# Patient Record
Sex: Female | Born: 1950 | Race: White | Hispanic: No | Smoking: Current every day smoker
Health system: Southern US, Community
[De-identification: ages and names within clinical notes are randomized; demographics above are authoritative.]

## PROBLEM LIST (undated history)

## (undated) DIAGNOSIS — T7840XA Allergy, unspecified, initial encounter: Secondary | ICD-10-CM

## (undated) DIAGNOSIS — Z8541 Personal history of malignant neoplasm of cervix uteri: Secondary | ICD-10-CM

## (undated) DIAGNOSIS — E785 Hyperlipidemia, unspecified: Secondary | ICD-10-CM

## (undated) DIAGNOSIS — C801 Malignant (primary) neoplasm, unspecified: Secondary | ICD-10-CM

## (undated) HISTORY — PX: ABDOMINAL HYSTERECTOMY: SHX81

## (undated) HISTORY — DX: Hyperlipidemia, unspecified: E78.5

## (undated) HISTORY — DX: Allergy, unspecified, initial encounter: T78.40XA

## (undated) HISTORY — DX: Personal history of malignant neoplasm of cervix uteri: Z85.41

---

## 2010-06-16 ENCOUNTER — Ambulatory Visit: Payer: Self-pay | Admitting: Nurse Practitioner

## 2011-06-22 ENCOUNTER — Ambulatory Visit: Payer: Self-pay | Admitting: Nurse Practitioner

## 2012-04-20 ENCOUNTER — Other Ambulatory Visit (HOSPITAL_COMMUNITY): Payer: Self-pay | Admitting: General Practice

## 2012-04-20 DIAGNOSIS — E785 Hyperlipidemia, unspecified: Secondary | ICD-10-CM

## 2012-05-03 ENCOUNTER — Ambulatory Visit: Payer: Self-pay

## 2012-05-04 ENCOUNTER — Other Ambulatory Visit (HOSPITAL_COMMUNITY): Payer: Self-pay

## 2012-06-23 ENCOUNTER — Ambulatory Visit: Payer: Self-pay

## 2013-07-04 ENCOUNTER — Ambulatory Visit: Payer: Self-pay | Admitting: Family

## 2014-06-13 ENCOUNTER — Other Ambulatory Visit: Payer: Self-pay | Admitting: Cardiology

## 2014-06-13 DIAGNOSIS — Z1231 Encounter for screening mammogram for malignant neoplasm of breast: Secondary | ICD-10-CM

## 2014-07-06 ENCOUNTER — Other Ambulatory Visit: Payer: Self-pay | Admitting: Cardiology

## 2014-07-06 ENCOUNTER — Ambulatory Visit: Payer: Self-pay

## 2014-07-06 ENCOUNTER — Ambulatory Visit
Admission: RE | Admit: 2014-07-06 | Discharge: 2014-07-06 | Disposition: A | Discharge status: Home / Self Care | Payer: MEDICARE | Source: Ambulatory Visit | Attending: Cardiology | Admitting: Cardiology

## 2014-07-06 DIAGNOSIS — Z1231 Encounter for screening mammogram for malignant neoplasm of breast: Secondary | ICD-10-CM | POA: Diagnosis present

## 2014-07-06 HISTORY — DX: Malignant (primary) neoplasm, unspecified: C80.1

## 2014-07-09 ENCOUNTER — Ambulatory Visit: Payer: Self-pay

## 2015-05-30 ENCOUNTER — Other Ambulatory Visit: Payer: Self-pay | Admitting: Internal Medicine

## 2015-05-30 DIAGNOSIS — Z1231 Encounter for screening mammogram for malignant neoplasm of breast: Secondary | ICD-10-CM

## 2015-07-09 ENCOUNTER — Ambulatory Visit
Admission: RE | Admit: 2015-07-09 | Discharge: 2015-07-09 | Disposition: A | Discharge status: Home / Self Care | Payer: MEDICARE | Source: Ambulatory Visit | Attending: Internal Medicine | Admitting: Internal Medicine

## 2015-07-09 ENCOUNTER — Other Ambulatory Visit: Payer: Self-pay | Admitting: Internal Medicine

## 2015-07-09 DIAGNOSIS — Z1231 Encounter for screening mammogram for malignant neoplasm of breast: Secondary | ICD-10-CM | POA: Insufficient documentation

## 2016-06-01 ENCOUNTER — Other Ambulatory Visit: Payer: Self-pay | Admitting: Internal Medicine

## 2016-06-01 DIAGNOSIS — Z1231 Encounter for screening mammogram for malignant neoplasm of breast: Secondary | ICD-10-CM

## 2016-06-22 ENCOUNTER — Ambulatory Visit
Admission: RE | Admit: 2016-06-22 | Discharge: 2016-06-22 | Disposition: A | Discharge status: Home / Self Care | Payer: MEDICARE | Source: Ambulatory Visit | Attending: Internal Medicine | Admitting: Internal Medicine

## 2016-06-22 DIAGNOSIS — Z1231 Encounter for screening mammogram for malignant neoplasm of breast: Secondary | ICD-10-CM

## 2016-07-13 ENCOUNTER — Ambulatory Visit
Admission: RE | Admit: 2016-07-13 | Discharge: 2016-07-13 | Disposition: A | Discharge status: Home / Self Care | Payer: MEDICARE | Source: Ambulatory Visit | Attending: Internal Medicine | Admitting: Internal Medicine

## 2016-07-13 DIAGNOSIS — Z1231 Encounter for screening mammogram for malignant neoplasm of breast: Secondary | ICD-10-CM | POA: Diagnosis not present

## 2017-02-09 ENCOUNTER — Ambulatory Visit: Payer: MEDICARE | Admitting: Internal Medicine

## 2017-02-09 ENCOUNTER — Telehealth: Payer: Self-pay

## 2017-02-09 DIAGNOSIS — Z0289 Encounter for other administrative examinations: Secondary | ICD-10-CM

## 2017-02-09 NOTE — Telephone Encounter (Signed)
Reason for call: fever 101 yesterday new, Symptoms: nausea, still able to drink fluid, flu like symptoms   Duration 2 weeks Medications:N/A NP establish care appt 02/16/17  Appointment available at 4 today Daughter Anderson [place] [phone]

## 2017-02-09 NOTE — Telephone Encounter (Signed)
Spoke with Dr Aundra Dubin and she agreed to see patient at 400pm today for acute visit only. Called patients daughter back and advised that Dr Aundra Dubin would see patient for acute problem only, she verbalized understanding.  She would then see patient back for establish care visit on 02/16/17.

## 2017-02-10 DIAGNOSIS — J209 Acute bronchitis, unspecified: Secondary | ICD-10-CM | POA: Diagnosis not present

## 2017-02-10 DIAGNOSIS — R05 Cough: Secondary | ICD-10-CM | POA: Diagnosis not present

## 2017-02-16 ENCOUNTER — Encounter: Payer: Self-pay | Admitting: Internal Medicine

## 2017-02-16 ENCOUNTER — Ambulatory Visit (INDEPENDENT_AMBULATORY_CARE_PROVIDER_SITE_OTHER): Payer: MEDICARE | Admitting: Internal Medicine

## 2017-02-16 VITALS — BP 134/70 | HR 85 | Temp 98.0°F | Ht 63.0 in | Wt 127.2 lb

## 2017-02-16 DIAGNOSIS — Z1159 Encounter for screening for other viral diseases: Secondary | ICD-10-CM | POA: Diagnosis not present

## 2017-02-16 DIAGNOSIS — Z1329 Encounter for screening for other suspected endocrine disorder: Secondary | ICD-10-CM

## 2017-02-16 DIAGNOSIS — Z122 Encounter for screening for malignant neoplasm of respiratory organs: Secondary | ICD-10-CM

## 2017-02-16 DIAGNOSIS — J3081 Allergic rhinitis due to animal (cat) (dog) hair and dander: Secondary | ICD-10-CM

## 2017-02-16 DIAGNOSIS — J309 Allergic rhinitis, unspecified: Secondary | ICD-10-CM | POA: Insufficient documentation

## 2017-02-16 DIAGNOSIS — Z13818 Encounter for screening for other digestive system disorders: Secondary | ICD-10-CM | POA: Diagnosis not present

## 2017-02-16 DIAGNOSIS — M25559 Pain in unspecified hip: Secondary | ICD-10-CM

## 2017-02-16 DIAGNOSIS — F17200 Nicotine dependence, unspecified, uncomplicated: Secondary | ICD-10-CM | POA: Diagnosis not present

## 2017-02-16 DIAGNOSIS — R32 Unspecified urinary incontinence: Secondary | ICD-10-CM

## 2017-02-16 DIAGNOSIS — J40 Bronchitis, not specified as acute or chronic: Secondary | ICD-10-CM | POA: Diagnosis not present

## 2017-02-16 DIAGNOSIS — M810 Age-related osteoporosis without current pathological fracture: Secondary | ICD-10-CM | POA: Diagnosis not present

## 2017-02-16 DIAGNOSIS — M25552 Pain in left hip: Secondary | ICD-10-CM

## 2017-02-16 DIAGNOSIS — E559 Vitamin D deficiency, unspecified: Secondary | ICD-10-CM | POA: Diagnosis not present

## 2017-02-16 DIAGNOSIS — E785 Hyperlipidemia, unspecified: Secondary | ICD-10-CM | POA: Diagnosis not present

## 2017-02-16 DIAGNOSIS — Z0000 Encounter for general adult medical examination without abnormal findings: Secondary | ICD-10-CM | POA: Diagnosis not present

## 2017-02-16 DIAGNOSIS — Z13220 Encounter for screening for lipoid disorders: Secondary | ICD-10-CM | POA: Diagnosis not present

## 2017-02-16 HISTORY — DX: Pain in unspecified hip: M25.559

## 2017-02-16 HISTORY — DX: Hyperlipidemia, unspecified: E78.5

## 2017-02-16 HISTORY — DX: Unspecified urinary incontinence: R32

## 2017-02-16 HISTORY — DX: Allergic rhinitis, unspecified: J30.9

## 2017-02-16 MED ORDER — CETIRIZINE HCL 10 MG PO CAPS: 1.0000 | ORAL_CAPSULE | Freq: Every evening | ORAL | 3 refills | Status: DC | PRN

## 2017-02-16 NOTE — Patient Instructions (Addendum)
Please sch labs 03/2017-04/2017 and f/u with me in [facility] 04/2017  Will refer for CT chest at [facility]  Take care   Smoking Tobacco Information Smoking tobacco will very likely harm your health. Tobacco contains a poisonous (toxic), colorless chemical called nicotine. Nicotine affects the brain and makes tobacco addictive. This change in your brain can make it hard to stop smoking. Tobacco also has other toxic chemicals that can hurt your body and raise your risk of many cancers. How can smoking tobacco affect me? Smoking tobacco can increase your chances of having serious health conditions, such as:  Cancer. Smoking is most commonly associated with lung cancer, but can lead to cancer in other parts of the body.  Chronic obstructive pulmonary disease (COPD). This is a long-term lung condition that makes it hard to breathe. It also gets worse over time.  High blood pressure (hypertension), heart disease, stroke, or heart attack.  Lung infections, such as pneumonia.  Cataracts. This is when the lenses in the eyes become clouded.  Digestive problems. This may include peptic ulcers, heartburn, and gastroesophageal reflux disease (GERD).  Oral health problems, such as gum disease and tooth loss.  Loss of taste and smell.  Smoking can affect your appearance by causing:  Wrinkles.  Yellow or stained teeth, fingers, and fingernails.  Smoking tobacco can also affect your social life.  Many workplaces, [employer], hotels, and public places are tobacco-free. This means that you may experience challenges in finding places to smoke when away from home.  The cost of a smoking habit can be expensive. Expenses for someone who smokes come in two ways: ? You spend money on a regular basis to buy tobacco. ? Your health care costs in the long-term are higher if you smoke.  Tobacco smoke can also affect the health of those around you. Children of smokers have greater chances of: ? Sudden infant death  syndrome (SIDS). ? Ear infections. ? Lung infections.  What lifestyle changes can be made?  Do not start smoking. Quit if you already do.  To quit smoking: ? Make a plan to quit smoking and commit yourself to it. Look for programs to help you and ask your health care provider for recommendations and ideas. ? Talk with your health care provider about using nicotine replacement medicines to help you quit. Medicine replacement medicines include gum, lozenges, patches, sprays, or pills. ? Do not replace cigarette smoking with electronic cigarettes, which are commonly called e-cigarettes. The safety of e-cigarettes is not known, and some may contain harmful chemicals. ? Avoid places, people, or situations that tempt you to smoke. ? If you try to quit but return to smoking, don't give up hope. It is very common for people to try a number of times before they fully succeed. When you feel ready again, give it another try.  Quitting smoking might affect the way you eat as well as your weight. Be prepared to monitor your eating habits. Get support in planning and following a healthy diet.  Ask your health care provider about having regular tests (screenings) to check for cancer. This may include blood tests, imaging tests, and other tests.  Exercise regularly. Consider taking walks, joining a gym, or doing yoga or exercise classes.  Develop skills to manage your stress. These skills include meditation. What are the benefits of quitting smoking? By quitting smoking, you may:  Lower your risk of getting cancer and other diseases caused by smoking.  Live longer.  Breathe better.  Lower  your blood pressure and heart rate.  Stop your addiction to tobacco.  Stop creating secondhand smoke that hurts other people.  Improve your sense of taste and smell.  Look better over time, due to having fewer wrinkles and less staining.  What can happen if changes are not made? If you do not stop smoking,  you may:  Get cancer and other diseases.  Develop COPD or other long-term (chronic) lung conditions.  Develop serious problems with your heart and blood vessels (cardiovascular system).  Need more tests to screen for problems caused by smoking.  Have higher, long-term healthcare costs from medicines or treatments related to smoking.  Continue to have worsening changes in your lungs, mouth, and nose.  Where to find support: To get support to quit smoking, consider:  Asking your health care provider for more information and resources.  Taking classes to learn more about quitting smoking.  Looking for local organizations that offer resources about quitting smoking.  Joining a support group for people who want to quit smoking in your local community.  Where to find more information: You may find more information about quitting smoking from:  HelpGuide.org: www.helpguide.org/articles/addictions/how-to-quit-smoking.htm  https://hall.com/: smokefree.gov  American Lung Association: www.lung.org  Contact a health care provider if:  You have problems breathing.  Your lips, nose, or fingers turn blue.  You have chest pain.  You are coughing up blood.  You feel faint or you pass out.  You have other noticeable changes that cause you to worry. Summary  Smoking tobacco can negatively affect your health, the health of those around you, your finances, and your social life.  Do not start smoking. Quit if you already do. If you need help quitting, ask your health care provider.  Think about joining a support group for people who want to quit smoking in your local community. There are many effective programs that will help you to quit this behavior. This information is not intended to replace advice given to you by your health care provider. Make sure you discuss any questions you have with your health care provider. Document Released: 01/14/2016 Document Revised: 01/14/2016 Document  Reviewed: 01/14/2016 Elsevier Interactive Patient Education  2018 [employer].  Chronic Bronchitis Chronic bronchitis is a lasting inflammation of the bronchial tubes, which are the tubes that carry air into your lungs. This is inflammation that occurs:  On most days of the week.  For at least three months at a time.  Over a period of two years in a row.  When the bronchial tubes are inflamed, they start to produce mucus. The inflammation and buildup of mucus make it more difficult to breathe. Chronic bronchitis is usually a permanent problem and is one type of chronic obstructive pulmonary disease (COPD). People with chronic bronchitis are at greater risk for getting repeated colds, or respiratory infections. What are the causes? Chronic bronchitis most often occurs in people who have:  Long-standing, severe asthma.  A history of smoking.  Asthma and who also smoke.  What are the signs or symptoms? Chronic bronchitis may cause the following:  A cough that brings up mucus (productive cough).  Shortness of breath.  Early morning headache.  Wheezing.  Chest discomfort.  Recurring respiratory infections.  How is this diagnosed? Your health care provider may confirm the diagnosis by:  Taking your medical history.  Performing a physical exam.  Taking a chest X-ray.  Performing pulmonary function tests.  How is this treated? Treatment involves controlling symptoms with medicines, oxygen  therapy, or making lifestyle changes, such as exercising and eating a healthy, well-balanced diet. Medicines could include:  Inhalers to improve air flow in and out of your lungs.  Antibiotics to treat bacterial infections, such as pneumonia, sinus infections, and acute bronchitis.  As a preventative measure, your health care provider may recommend routine vaccinations for influenza and pneumonia. This is to prevent infection and hospitalization since you may be more at risk for  these types of infections. Follow these instructions at home:  Take medicines only as directed by your health care provider.  If you smoke cigarettes, chew tobacco, or use electronic cigarettes, quit. If you need help quitting, ask your health care provider.  Avoid pollen, dust, animal dander, molds, smoke, and other things that cause shortness of breath or wheezing attacks.  Talk to your health care provider about possible exercise routines. Regular exercise is very important to help you feel better.  If you are prescribed oxygen use at home follow these guidelines: ? Never smoke while using oxygen. Oxygen does not burn or explode, but flammable materials will burn faster in the presence of oxygen. ? Keep a Data processing manager close by. Let your fire department know that you have oxygen in your home. ? Warn visitors not to smoke near you when you are using oxygen. Put up "no smoking" signs in your home where you most often use the oxygen. ? Regularly test your smoke detectors at home to make sure they work. If you receive care in your home from a nurse or other health care provider, he or she may also check to make sure your smoke detectors work.  Ask your health care provider whether you would benefit from a pulmonary rehabilitation program.  Do not wait to get medical care if you have any concerning symptoms. Delays could cause permanent injury and may be life threatening. Contact a health care provider if:  You have increased coughing or shortness of breath or both.  You have muscle aches.  You have chest pain.  Your mucus gets thicker.  Your mucus changes from clear or white to yellow, green, gray, or bloody. Get help right away if:  Your usual medicines do not stop your wheezing.  You have increased difficulty breathing.  You have any problems with the medicine you are taking, such as a rash, itching, swelling, or trouble breathing. This information is not intended to  replace advice given to you by your health care provider. Make sure you discuss any questions you have with your health care provider. Document Released: 10/16/2005 Document Revised: 05/09/2015 Document Reviewed: 02/06/2013 Elsevier Interactive Patient Education  [employer].

## 2017-02-16 NOTE — Progress Notes (Signed)
Pre visit review using our clinic review tool, if applicable. No additional management support is needed unless otherwise documented below in the visit note. 

## 2017-02-16 NOTE — Progress Notes (Signed)
Chief Complaint  Patient presents with  . Establish Care   Establish care  1. 02/10/17 went to Urgent care for bronchitis given zpak and prednisone starting at 50 mg. She feels somewhat better tried Robitussin DM, Delsym. She is smoker 20-30 years max 4 pks/week now 1-2 pks/week FH lung cancer in mother  2. Allergic rhinitis h/o allergies to cats wants zyrtec capsules filled  3. H/o left hip pain chronic since remote fall worse with sitting long periods 4. H/o HLD on Lipitor 20 mg qhs 5. H/o osteoporosis on Fosamax  6. Chronic urinary incontinence     Review of Systems  Constitutional: Negative for weight loss.  HENT: Negative for hearing loss.   Eyes:       No vision changes   Respiratory: Positive for cough. Negative for shortness of breath and wheezing.   Cardiovascular: Negative for chest pain.  Gastrointestinal: Negative for abdominal pain.  Musculoskeletal: Positive for joint pain. Negative for falls.  Skin: Negative for rash.  Neurological: Negative for headaches.  Psychiatric/Behavioral: Negative for memory loss.   Past Medical History:  Diagnosis Date  . Allergy    cats   . Cancer ([place])    cervical  . Hyperlipidemia    Past Surgical History:  Procedure Laterality Date  . ABDOMINAL HYSTERECTOMY     1983   Family History  Problem Relation Age of Onset  . Cancer Mother        lung cancer smoker died 39  . Muscular dystrophy Father   . Muscular dystrophy Sister   . Muscular dystrophy Sister   . Breast cancer Neg Hx    Social History   Socioeconomic History  . Marital status: Widowed    Spouse name: Not on file  . Number of children: Not on file  . Years of education: Not on file  . Highest education level: Not on file  Social Needs  . Financial resource strain: Not on file  . Food insecurity - worry: Not on file  . Food insecurity - inability: Not on file  . Transportation needs - medical: Not on file  . Transportation needs - non-medical: Not on file   Occupational History  . Not on file  Tobacco Use  . Smoking status: Current Every Day Smoker  . Smokeless tobacco: Never Used  Substance and Sexual Activity  . Alcohol use: No    Frequency: Never  . Drug use: No  . Sexual activity: No  Other Topics Concern  . Not on file  Social History Narrative   Widowed    Current Meds  Medication Sig  . alendronate (FOSAMAX) 70 MG tablet   . atorvastatin (LIPITOR) 20 MG tablet   . TRELEGY ELLIPTA 100-62.5-25 MCG/INH AEPB    Not on File No results found for this or any previous visit (from the past 2160 hour(s)). Objective  Body mass index is 22.53 kg/m. Wt Readings from Last 3 Encounters:  02/16/17 127 lb 3.2 oz (57.7 kg)   Temp Readings from Last 3 Encounters:  02/16/17 98 F (36.7 C) (Oral)   BP Readings from Last 3 Encounters:  02/16/17 134/70   Pulse Readings from Last 3 Encounters:  02/16/17 85   O2 sat room air 93%   Physical Exam  Constitutional: She is oriented to person, place, and time and well-developed, well-nourished, and in no distress.  HENT:  Head: Normocephalic and atraumatic.  Eyes: Conjunctivae are normal. Pupils are equal, round, and reactive to light.  Cardiovascular: Normal rate,  regular rhythm and normal heart sounds.  Pulmonary/Chest: Effort normal and breath sounds normal.  Abdominal: Soft. Bowel sounds are normal. There is no tenderness.  Neurological: She is alert and oriented to person, place, and time. Gait normal.  Skin: Skin is warm and dry.  Psychiatric: Mood, memory, affect and judgment normal.  Nursing note and vitals reviewed.   Assessment   1. Bronchitis, long term nicotine dependence (see HPI)  2. Allergic rhinitis  3. HLD  4. Left hip pain  5. Chronic urinary incontinence  6. HM Plan  1.  Completed tx  Order CT chest low dose  rec smoking cessation  2. rx refill Zyrtec  3. Cont lipitor 20 mg qhs  Check labs CMET, CBC, lipid, UA, TSH, T4 ,Hep B/C, vit D  In 3-04/2017  F/u  04/2017  4. Monitor if worsening do Xray  5. kegel exercises for now  Will disc other options in future  6.  Had flu shot at Dr. Harless Litten 2018 need to verify  Had pna at [facility] need verify [place]  Tdap 05/25/12  prevnar may have had 06/22/16  zostervax 01/23/09 disc shingrix today   mammo neg 07/13/16  Pap s/p hysterectomy had 1 nl years ago with ho abnormal; pap s/p hysterectomy nl per pt  Colonoscopy had [place] per pt review Dr. Harless Litten records  DEXA 04/2012 normal ? If others with Dr. Lavera Guise  +smoker rec cessation smoking 20-30 years h/o lung cancer in mother max 4 pks/week now 1-2 pks/week.   Signed form for jury duty today   Eye MD [facility]  Wears seatbelt, no guns  Provider: Dr. Olivia Mackie McLean-Scocuzza-Internal Medicine

## 2017-02-25 ENCOUNTER — Telehealth: Payer: Self-pay | Admitting: *Deleted

## 2017-02-25 DIAGNOSIS — Z87891 Personal history of nicotine dependence: Secondary | ICD-10-CM

## 2017-02-25 DIAGNOSIS — Z122 Encounter for screening for malignant neoplasm of respiratory organs: Secondary | ICD-10-CM

## 2017-02-25 NOTE — Telephone Encounter (Signed)
Received referral for initial lung cancer screening scan. Contacted patient and obtained smoking history,(current, 32.2 pack year) as well as answering questions related to screening process. Patient denies signs of lung cancer such as weight loss or hemoptysis. Patient denies comorbidity that would prevent curative treatment if lung cancer were found. Patient is scheduled for shared decision making visit and CT scan on 03/23/17.

## 2017-02-26 ENCOUNTER — Ambulatory Visit: Admission: RE | Admit: 2017-02-26 | Payer: MEDICARE | Source: Ambulatory Visit

## 2017-03-17 ENCOUNTER — Telehealth: Payer: Self-pay | Admitting: *Deleted

## 2017-03-17 NOTE — Telephone Encounter (Signed)
Spoke with patient . The imaging place is changing the time.

## 2017-03-17 NOTE — Telephone Encounter (Signed)
Copied from [place] [phone]. Topic: Appointment Scheduling - Scheduling Inquiry for Clinic >> Mar 17, 2017 11:25 AM Conception Chancy, NT wrote: Patient is calling and states she received a phone call stating she needed to call and confirm her appt on 03/22/17 at 9am. I am not showing that she has an appt. She also needs clarification on her CT scan appt on 03/23/17. She states she can not make it at 2pm. Patient requesting a call back from Dr. Aundra Dubin nurse.

## 2017-03-17 NOTE — Telephone Encounter (Signed)
Patient requested to reschedule lung screening scan. Rescheduled to 04/08/17

## 2017-03-22 ENCOUNTER — Ambulatory Visit: Payer: Self-pay | Admitting: Internal Medicine

## 2017-03-23 ENCOUNTER — Ambulatory Visit: Payer: MEDICARE

## 2017-03-23 ENCOUNTER — Inpatient Hospital Stay: Payer: MEDICARE | Admitting: Oncology

## 2017-04-08 ENCOUNTER — Encounter: Payer: Self-pay | Admitting: Nurse Practitioner

## 2017-04-08 ENCOUNTER — Ambulatory Visit
Admission: RE | Admit: 2017-04-08 | Discharge: 2017-04-08 | Disposition: A | Discharge status: Home / Self Care | Payer: MEDICARE | Source: Ambulatory Visit | Attending: Oncology | Admitting: Oncology

## 2017-04-08 ENCOUNTER — Inpatient Hospital Stay: Payer: MEDICARE | Attending: Nurse Practitioner | Admitting: Nurse Practitioner

## 2017-04-08 DIAGNOSIS — I2584 Coronary atherosclerosis due to calcified coronary lesion: Secondary | ICD-10-CM | POA: Diagnosis not present

## 2017-04-08 DIAGNOSIS — Z122 Encounter for screening for malignant neoplasm of respiratory organs: Secondary | ICD-10-CM | POA: Diagnosis not present

## 2017-04-08 DIAGNOSIS — J439 Emphysema, unspecified: Secondary | ICD-10-CM | POA: Insufficient documentation

## 2017-04-08 DIAGNOSIS — F17210 Nicotine dependence, cigarettes, uncomplicated: Secondary | ICD-10-CM | POA: Diagnosis not present

## 2017-04-08 DIAGNOSIS — I700 Atherosclerosis of aorta: Secondary | ICD-10-CM | POA: Insufficient documentation

## 2017-04-08 DIAGNOSIS — I2510 Atherosclerotic heart disease of native coronary artery without angina pectoris: Secondary | ICD-10-CM | POA: Diagnosis not present

## 2017-04-08 DIAGNOSIS — Z87891 Personal history of nicotine dependence: Secondary | ICD-10-CM

## 2017-04-08 NOTE — Progress Notes (Signed)
In accordance with CMS guidelines, patient has met eligibility criteria including age, absence of signs or symptoms of lung cancer.  Social History   Tobacco Use  . Smoking status: Current Every Day Smoker    Packs/day: 0.70    Years: 46.00    Pack years: 32.20  . Smokeless tobacco: Never Used  Substance Use Topics  . Alcohol use: No    Frequency: Never  . Drug use: No     A shared decision-making session was conducted prior to the performance of CT scan. This includes one or more decision aids, includes benefits and harms of screening, follow-up diagnostic testing, over-diagnosis, false positive rate, and total radiation exposure.  Counseling on the importance of adherence to annual lung cancer LDCT screening, impact of co-morbidities, and ability or willingness to undergo diagnosis and treatment is imperative for compliance of the program.  Counseling on the importance of continued smoking cessation for former smokers; the importance of smoking cessation for current smokers, and information about tobacco cessation interventions have been given to patient including [place] and [facility] quit [place] programs.  Written order for lung cancer screening with LDCT has been given to the patient and any and all questions have been answered to the best of my abilities.   Yearly follow up will be coordinated by Burgess Estelle, Thoracic Navigator.  Beckey Rutter, DNP, AGNP-C [place] [facility] [phone] (office) 04/08/17 4:16 PM

## 2017-04-12 ENCOUNTER — Encounter: Payer: Self-pay | Admitting: *Deleted

## 2017-04-14 ENCOUNTER — Other Ambulatory Visit (INDEPENDENT_AMBULATORY_CARE_PROVIDER_SITE_OTHER): Payer: MEDICARE

## 2017-04-14 DIAGNOSIS — E559 Vitamin D deficiency, unspecified: Secondary | ICD-10-CM

## 2017-04-14 DIAGNOSIS — Z1329 Encounter for screening for other suspected endocrine disorder: Secondary | ICD-10-CM | POA: Diagnosis not present

## 2017-04-14 DIAGNOSIS — Z13220 Encounter for screening for lipoid disorders: Secondary | ICD-10-CM | POA: Diagnosis not present

## 2017-04-14 DIAGNOSIS — E785 Hyperlipidemia, unspecified: Secondary | ICD-10-CM | POA: Diagnosis not present

## 2017-04-14 DIAGNOSIS — Z1159 Encounter for screening for other viral diseases: Secondary | ICD-10-CM

## 2017-04-14 DIAGNOSIS — M810 Age-related osteoporosis without current pathological fracture: Secondary | ICD-10-CM

## 2017-04-14 NOTE — Addendum Note (Signed)
Addended by: Arby Barrette on: 04/14/2017 08:21 AM   Modules accepted: Orders

## 2017-04-15 LAB — CBC WITH DIFFERENTIAL/PLATELET
BASOS PCT: 0.3 %
Basophils Absolute: 29 cells/uL (ref 0–200)
Eosinophils Absolute: 57 cells/uL (ref 15–500)
Eosinophils Relative: 0.6 %
HCT: 44.4 % (ref 35.0–45.0)
Hemoglobin: 15.3 g/dL (ref 11.7–15.5)
Lymphs Abs: 3221 cells/uL (ref 850–3900)
MCH: 31.4 pg (ref 27.0–33.0)
MCHC: 34.5 g/dL (ref 32.0–36.0)
MCV: 91.2 fL (ref 80.0–100.0)
MONOS PCT: 6.5 %
MPV: 10.5 fL (ref 7.5–12.5)
NEUTROS ABS: 5577 {cells}/uL (ref 1500–7800)
Neutrophils Relative %: 58.7 %
Platelets: 309 10*3/uL (ref 140–400)
RBC: 4.87 10*6/uL (ref 3.80–5.10)
RDW: 12.9 % (ref 11.0–15.0)
TOTAL LYMPHOCYTE: 33.9 %
WBC: 9.5 10*3/uL (ref 3.8–10.8)
WBCMIX: 618 {cells}/uL (ref 200–950)

## 2017-04-15 LAB — HEPATITIS B SURFACE ANTIBODY, QUANTITATIVE: Hepatitis B-Post: <5 m[IU]/mL — ABNORMAL LOW (ref >=10)

## 2017-04-15 LAB — COMPREHENSIVE METABOLIC PANEL
AG Ratio: 1.4 (calc) (ref 1.0–2.5)
ALKALINE PHOSPHATASE (APISO): 78 U/L (ref 33–130)
ALT: 10 U/L (ref 6–29)
AST: 15 U/L (ref 10–35)
Albumin: 4.1 g/dL (ref 3.6–5.1)
BUN: 13 mg/dL (ref 7–25)
CO2: 24 mmol/L (ref 20–32)
Calcium: 8.8 mg/dL (ref 8.6–10.4)
Chloride: 106 mmol/L (ref 98–110)
Creat: 0.64 mg/dL (ref 0.50–0.99)
Globulin: 2.9 g/dL (calc) (ref 1.9–3.7)
Glucose, Bld: 87 mg/dL (ref 65–99)
Potassium: 4.1 mmol/L (ref 3.5–5.3)
SODIUM: 139 mmol/L (ref 135–146)
TOTAL PROTEIN: 7 g/dL (ref 6.1–8.1)
Total Bilirubin: 0.3 mg/dL (ref 0.2–1.2)

## 2017-04-15 LAB — HEPATITIS C ANTIBODY
Hepatitis C Ab: NONREACTIVE
SIGNAL TO CUT-OFF: 0.05 (ref <1.00)

## 2017-04-15 LAB — LIPID PANEL
CHOLESTEROL: 202 mg/dL — AB (ref <200)
HDL: 47 mg/dL — ABNORMAL LOW (ref >50)
LDL CHOLESTEROL (CALC): 135 mg/dL — AB
Non-HDL Cholesterol (Calc): 155 mg/dL (calc) — ABNORMAL HIGH (ref <130)
TRIGLYCERIDES: 101 mg/dL (ref <150)
Total CHOL/HDL Ratio: 4.3 (calc) (ref <5.0)

## 2017-04-15 LAB — TSH: TSH: 1.09 m[IU]/L (ref 0.40–4.50)

## 2017-04-15 LAB — VITAMIN D 25 HYDROXY (VIT D DEFICIENCY, FRACTURES): Vit D, 25-Hydroxy: 12 ng/mL — ABNORMAL LOW (ref 30–100)

## 2017-04-18 ENCOUNTER — Other Ambulatory Visit: Payer: Self-pay | Admitting: Internal Medicine

## 2017-04-18 DIAGNOSIS — E559 Vitamin D deficiency, unspecified: Secondary | ICD-10-CM

## 2017-04-18 MED ORDER — CHOLECALCIFEROL 1.25 MG (50000 UT) PO CAPS: 50000.0000 [IU] | ORAL_CAPSULE | ORAL | 1 refills | Status: DC

## 2017-05-20 ENCOUNTER — Other Ambulatory Visit: Payer: Self-pay | Admitting: Internal Medicine

## 2017-05-20 DIAGNOSIS — J3081 Allergic rhinitis due to animal (cat) (dog) hair and dander: Secondary | ICD-10-CM

## 2017-05-20 MED ORDER — CETIRIZINE HCL 10 MG PO CAPS: 1.0000 | ORAL_CAPSULE | Freq: Every evening | ORAL | 3 refills | Status: DC | PRN

## 2017-06-08 ENCOUNTER — Other Ambulatory Visit: Payer: Self-pay | Admitting: Internal Medicine

## 2017-06-08 DIAGNOSIS — Z1231 Encounter for screening mammogram for malignant neoplasm of breast: Secondary | ICD-10-CM

## 2017-07-29 ENCOUNTER — Ambulatory Visit
Admission: RE | Admit: 2017-07-29 | Discharge: 2017-07-29 | Disposition: A | Discharge status: Home / Self Care | Payer: MEDICARE | Source: Ambulatory Visit | Attending: Internal Medicine | Admitting: Internal Medicine

## 2017-07-29 DIAGNOSIS — Z1231 Encounter for screening mammogram for malignant neoplasm of breast: Secondary | ICD-10-CM | POA: Diagnosis not present

## 2017-08-06 ENCOUNTER — Other Ambulatory Visit: Payer: Self-pay | Admitting: Internal Medicine

## 2017-08-06 DIAGNOSIS — M810 Age-related osteoporosis without current pathological fracture: Secondary | ICD-10-CM

## 2017-08-06 MED ORDER — ALENDRONATE SODIUM 70 MG PO TABS: 70.0000 mg | ORAL_TABLET | ORAL | 0 refills | Status: DC

## 2017-09-27 ENCOUNTER — Other Ambulatory Visit: Payer: Self-pay | Admitting: Internal Medicine

## 2017-09-27 ENCOUNTER — Telehealth: Payer: Self-pay | Admitting: Internal Medicine

## 2017-09-27 DIAGNOSIS — E785 Hyperlipidemia, unspecified: Secondary | ICD-10-CM

## 2017-09-27 DIAGNOSIS — J3081 Allergic rhinitis due to animal (cat) (dog) hair and dander: Secondary | ICD-10-CM

## 2017-09-27 DIAGNOSIS — M810 Age-related osteoporosis without current pathological fracture: Secondary | ICD-10-CM

## 2017-09-27 MED ORDER — ALENDRONATE SODIUM 70 MG PO TABS: 70.0000 mg | ORAL_TABLET | ORAL | 0 refills | Status: DC

## 2017-09-27 MED ORDER — ATORVASTATIN CALCIUM 20 MG PO TABS: 20.0000 mg | ORAL_TABLET | Freq: Every day | ORAL | 3 refills | Status: DC

## 2017-09-27 NOTE — Telephone Encounter (Signed)
Request for rx

## 2017-09-27 NOTE — Telephone Encounter (Signed)
Called and notified pt that appt was needed in order to receive refills of requested medications. Earliest appt time available for pt to come due to her schedulehe is on 11/17/17. Pt also would need a new prescription for Atorvastatin 40 mg tablet.Last refill of Atorvastatin by historical provider. Previous prescription was for Atorvastatin 20 mg tab and pt states she was advised to take 2 tablets a day. Pt requesting refills of Alendronate and Atorvastatin until seen for appt on 11/17/17.

## 2017-09-27 NOTE — Telephone Encounter (Unsigned)
Copied from [place] [phone]. Topic: Quick Communication - See Telephone Encounter >> Sep 27, 2017  8:34 AM Marja Kays F wrote: Pt is needing a refill on alendronate and atorvastatin  Best number [phone]   [facility]

## 2017-09-27 NOTE — Telephone Encounter (Signed)
Ok to refill 

## 2017-09-27 NOTE — Telephone Encounter (Signed)
Will refill Fosamax but we need to repeat bone density before her next appt  Dexa/bone density ordered will sch and let her know   Fosamax is not rec long term more than 5 years  Will do temp supply   [place]

## 2017-10-01 NOTE — Telephone Encounter (Signed)
Patient has made [id number].

## 2017-10-05 ENCOUNTER — Other Ambulatory Visit: Payer: MEDICARE

## 2017-10-12 ENCOUNTER — Ambulatory Visit
Admission: RE | Admit: 2017-10-12 | Discharge: 2017-10-12 | Disposition: A | Discharge status: Home / Self Care | Payer: MEDICARE | Source: Ambulatory Visit | Attending: Internal Medicine | Admitting: Internal Medicine

## 2017-10-12 DIAGNOSIS — M810 Age-related osteoporosis without current pathological fracture: Secondary | ICD-10-CM | POA: Diagnosis not present

## 2017-10-12 DIAGNOSIS — E2839 Other primary ovarian failure: Secondary | ICD-10-CM | POA: Diagnosis not present

## 2017-10-13 ENCOUNTER — Encounter: Payer: Self-pay | Admitting: *Deleted

## 2017-11-17 ENCOUNTER — Other Ambulatory Visit: Payer: Self-pay | Admitting: Internal Medicine

## 2017-11-17 ENCOUNTER — Telehealth: Payer: Self-pay | Admitting: Internal Medicine

## 2017-11-17 ENCOUNTER — Ambulatory Visit (INDEPENDENT_AMBULATORY_CARE_PROVIDER_SITE_OTHER): Payer: MEDICARE

## 2017-11-17 ENCOUNTER — Ambulatory Visit (INDEPENDENT_AMBULATORY_CARE_PROVIDER_SITE_OTHER): Payer: MEDICARE | Admitting: Internal Medicine

## 2017-11-17 ENCOUNTER — Encounter

## 2017-11-17 ENCOUNTER — Encounter: Payer: Self-pay | Admitting: Internal Medicine

## 2017-11-17 ENCOUNTER — Other Ambulatory Visit: Payer: Self-pay

## 2017-11-17 VITALS — BP 124/64 | HR 63 | Temp 98.1°F | Resp 15 | Ht 62.5 in | Wt 127.4 lb

## 2017-11-17 VITALS — BP 124/64 | HR 63 | Temp 98.1°F | Ht 62.5 in | Wt 127.4 lb

## 2017-11-17 DIAGNOSIS — Z0000 Encounter for general adult medical examination without abnormal findings: Secondary | ICD-10-CM

## 2017-11-17 DIAGNOSIS — E559 Vitamin D deficiency, unspecified: Secondary | ICD-10-CM | POA: Diagnosis not present

## 2017-11-17 DIAGNOSIS — J449 Chronic obstructive pulmonary disease, unspecified: Secondary | ICD-10-CM | POA: Insufficient documentation

## 2017-11-17 DIAGNOSIS — Z1211 Encounter for screening for malignant neoplasm of colon: Secondary | ICD-10-CM

## 2017-11-17 DIAGNOSIS — R319 Hematuria, unspecified: Secondary | ICD-10-CM

## 2017-11-17 DIAGNOSIS — I2510 Atherosclerotic heart disease of native coronary artery without angina pectoris: Secondary | ICD-10-CM

## 2017-11-17 DIAGNOSIS — L570 Actinic keratosis: Secondary | ICD-10-CM | POA: Diagnosis not present

## 2017-11-17 DIAGNOSIS — Z1389 Encounter for screening for other disorder: Secondary | ICD-10-CM | POA: Diagnosis not present

## 2017-11-17 DIAGNOSIS — J439 Emphysema, unspecified: Secondary | ICD-10-CM

## 2017-11-17 DIAGNOSIS — I25118 Atherosclerotic heart disease of native coronary artery with other forms of angina pectoris: Secondary | ICD-10-CM

## 2017-11-17 DIAGNOSIS — Z720 Tobacco use: Secondary | ICD-10-CM

## 2017-11-17 DIAGNOSIS — Z1159 Encounter for screening for other viral diseases: Secondary | ICD-10-CM

## 2017-11-17 DIAGNOSIS — E785 Hyperlipidemia, unspecified: Secondary | ICD-10-CM

## 2017-11-17 DIAGNOSIS — Z0184 Encounter for antibody response examination: Secondary | ICD-10-CM

## 2017-11-17 HISTORY — DX: Actinic keratosis: L57.0

## 2017-11-17 HISTORY — DX: Chronic obstructive pulmonary disease, unspecified: J44.9

## 2017-11-17 HISTORY — DX: Vitamin D deficiency, unspecified: E55.9

## 2017-11-17 HISTORY — DX: Atherosclerotic heart disease of native coronary artery without angina pectoris: I25.10

## 2017-11-17 HISTORY — DX: Tobacco use: Z72.0

## 2017-11-17 LAB — CBC WITH DIFFERENTIAL/PLATELET
BASOS PCT: 0.4 % (ref 0.0–3.0)
Basophils Absolute: 0 10*3/uL (ref 0.0–0.1)
EOS ABS: 0.1 10*3/uL (ref 0.0–0.7)
EOS PCT: 0.8 % (ref 0.0–5.0)
HCT: 43.3 % (ref 36.0–46.0)
Hemoglobin: 14.5 g/dL (ref 12.0–15.0)
LYMPHS ABS: 3.1 10*3/uL (ref 0.7–4.0)
Lymphocytes Relative: 32.8 % (ref 12.0–46.0)
MCHC: 33.4 g/dL (ref 30.0–36.0)
MCV: 96.8 fl (ref 78.0–100.0)
MONO ABS: 0.6 10*3/uL (ref 0.1–1.0)
Monocytes Relative: 6.6 % (ref 3.0–12.0)
NEUTROS PCT: 59.4 % (ref 43.0–77.0)
Neutro Abs: 5.6 10*3/uL (ref 1.4–7.7)
PLATELETS: 326 10*3/uL (ref 150.0–400.0)
RBC: 4.47 Mil/uL (ref 3.87–5.11)
RDW: 14 % (ref 11.5–15.5)
WBC: 9.4 10*3/uL (ref 4.0–10.5)

## 2017-11-17 LAB — URINALYSIS, ROUTINE W REFLEX MICROSCOPIC
Bilirubin Urine: NEGATIVE
Ketones, ur: NEGATIVE
Leukocytes, UA: NEGATIVE
Nitrite: NEGATIVE
SPECIFIC GRAVITY, URINE: 1.02 (ref 1.000–1.030)
TOTAL PROTEIN, URINE-UPE24: NEGATIVE
UROBILINOGEN UA: 0.2 (ref 0.0–1.0)
Urine Glucose: NEGATIVE
pH: 6 (ref 5.0–8.0)

## 2017-11-17 LAB — COMPREHENSIVE METABOLIC PANEL
ALT: 10 U/L (ref 0–35)
AST: 14 U/L (ref 0–37)
Albumin: 4.2 g/dL (ref 3.5–5.2)
Alkaline Phosphatase: 75 U/L (ref 39–117)
BUN: 12 mg/dL (ref 6–23)
CHLORIDE: 103 meq/L (ref 96–112)
CO2: 26 meq/L (ref 19–32)
Calcium: 9.8 mg/dL (ref 8.4–10.5)
Creatinine, Ser: 0.72 mg/dL (ref 0.40–1.20)
GFR: 85.44 mL/min (ref >60.00)
GLUCOSE: 84 mg/dL (ref 70–99)
POTASSIUM: 4.1 meq/L (ref 3.5–5.1)
Sodium: 138 mEq/L (ref 135–145)
Total Bilirubin: 0.3 mg/dL (ref 0.2–1.2)
Total Protein: 7.2 g/dL (ref 6.0–8.3)

## 2017-11-17 LAB — LIPID PANEL
CHOLESTEROL: 187 mg/dL (ref 0–200)
HDL: 49.2 mg/dL (ref >39.00)
LDL CALC: 113 mg/dL — AB (ref 0–99)
NonHDL: 138.14
Total CHOL/HDL Ratio: 4
Triglycerides: 127 mg/dL (ref 0.0–149.0)
VLDL: 25.4 mg/dL (ref 0.0–40.0)

## 2017-11-17 LAB — VITAMIN D 25 HYDROXY (VIT D DEFICIENCY, FRACTURES): VITD: 28.6 ng/mL — ABNORMAL LOW (ref 30.00–100.00)

## 2017-11-17 NOTE — Telephone Encounter (Signed)
Copied from [place] [phone]. Topic: Quick Communication - See Telephone Encounter >> Nov 17, 2017 11:23 AM [name], NT wrote: CRM for notification. See Telephone encounter for: 11/17/17. Pt states she would not like the [place] sent in yet to the pharmacy, and when she needs the refill she will reach out to the office.

## 2017-11-17 NOTE — Progress Notes (Signed)
Pre visit review using our clinic review tool, if applicable. No additional management support is needed unless otherwise documented below in the visit note. 

## 2017-11-17 NOTE — Progress Notes (Signed)
Subjective:   Brittany Burch is a 67 y.o. female who presents for an Initial Medicare Annual Wellness Visit.  Review of Systems    No ROS.  Medicare Wellness Visit. Additional risk factors are reflected in the social history.  Cardiac Risk Factors include: advanced age (>74men, >40 women);hypertension     Objective:    Today's Vitals   11/17/17 0833  BP: 124/64  Pulse: 63  Resp: 15  Temp: 98.1 F (36.7 C)  TempSrc: Oral  SpO2: 95%  Weight: 127 lb 6.4 oz (57.8 kg)  Height: 5' 2.5" (1.588 m)   Body mass index is 22.93 kg/m.  Advanced Directives 11/17/2017  Does Patient Have a Medical Advance Directive? No  Would patient like information on creating a medical advance directive? Yes (MAU/Ambulatory/Procedural Areas - Information given)    Current Medications (verified) Outpatient Encounter Medications as of 11/17/2017  Medication Sig  . atorvastatin (LIPITOR) 20 MG tablet Take 1 tablet (20 mg total) by mouth daily at 6 PM.  . [DISCONTINUED] Cholecalciferol 50000 units capsule Take 1 capsule (50,000 Units total) by mouth once a week.  . [DISCONTINUED] alendronate (FOSAMAX) 70 MG tablet Take 1 tablet (70 mg total) by mouth once a week. (Patient not taking: Reported on 11/17/2017)  . [DISCONTINUED] Cetirizine HCl (ZYRTEC ALLERGY) 10 MG CAPS Take 1 capsule (10 mg total) by mouth at bedtime as needed. (Patient not taking: Reported on 11/17/2017)  . [DISCONTINUED] TRELEGY ELLIPTA 100-62.5-25 MCG/INH AEPB    No facility-administered encounter medications on file as of 11/17/2017.     Allergies (verified) Patient has no allergy information on record.   History: Past Medical History:  Diagnosis Date  . Allergy    cats   . Cancer ([place])    cervical  . Hyperlipidemia    Past Surgical History:  Procedure Laterality Date  . ABDOMINAL HYSTERECTOMY     1983   Family History  Problem Relation Age of Onset  . Cancer Mother        lung cancer smoker died 51  . Muscular  dystrophy Father   . Muscular dystrophy Sister   . Muscular dystrophy Sister   . Breast cancer Neg Hx    Social History   Socioeconomic History  . Marital status: Widowed    Spouse name: Not on file  . Number of children: Not on file  . Years of education: Not on file  . Highest education level: Not on file  Occupational History  . Not on file  Social Needs  . Financial resource strain: Not hard at all  . Food insecurity:    Worry: Never true    Inability: Never true  . Transportation needs:    Medical: No    Non-medical: No  Tobacco Use  . Smoking status: Current Every Day Smoker    Packs/day: 0.70    Years: 46.00    Pack years: 32.20  . Smokeless tobacco: Never Used  Substance and Sexual Activity  . Alcohol use: No    Frequency: Never  . Drug use: No  . Sexual activity: Never  Lifestyle  . Physical activity:    Days per week: 4 days    Minutes per session: 20 min  . Stress: Not at all  Relationships  . Social connections:    Talks on phone: Not on file    Gets together: Not on file    Attends religious service: Not on file    Active member of club or organization: Not  on file    Attends meetings of clubs or organizations: Not on file    Relationship status: Not on file  Other Topics Concern  . Not on file  Social History Narrative   Widowed    12 grade education     Tobacco Counseling Ready to quit: Not Answered Counseling given: Not Answered   Clinical Intake:  Pre-visit preparation completed: Yes  Pain : No/denies pain     Nutritional Status: BMI of 19-24  Normal Diabetes: No  How often do you need to have someone help you when you read instructions, pamphlets, or other written materials from your doctor or pharmacy?: 1 - Never  Interpreter Needed?: No      Activities of Daily Living In your present state of health, do you have any difficulty performing the following activities: 11/17/2017  Hearing? N  Vision? N  Difficulty  concentrating or making decisions? N  Walking or climbing stairs? N  Dressing or bathing? N  Doing errands, shopping? N  Preparing Food and eating ? N  Using the Toilet? N  In the past six months, have you accidently leaked urine? N  Do you have problems with loss of bowel control? N  Managing your Medications? N  Managing your Finances? N  Housekeeping or managing your Housekeeping? N  Some recent data might be hidden     Immunizations and Health Maintenance Immunization History  Administered Date(s) Administered  . Influenza, High Dose Seasonal PF 09/24/2017  . Pneumococcal Conjugate-13 06/22/2016  . Pneumococcal Polysaccharide-23 09/04/2017   Health Maintenance Due  Topic Date Due  . TETANUS/TDAP  03/09/1968  . COLONOSCOPY  03/10/1999    Patient Care Team: McLean-Scocuzza, Nino Glow, MD as PCP - General (Internal Medicine)  Indicate any recent Medical Services you may have received from other than [facility] providers in the past year (date may be approximate).     Assessment:   This is a routine wellness examination for Brittany Burch. The goal of the wellness visit is to assist the patient how to close the gaps in care and create a preventative care plan for the patient.   The roster of all physicians providing medical care to patient is listed in the Snapshot section of the chart.  Osteoporosis reviewed.  No longer taking fosamax.   Safety issues reviewed; Smoke and carbon monoxide detectors in the home. No firearms in the home. Wears seatbelts when driving or riding with others. No violence in the home.  They do not have excessive sun exposure.  Discussed the need for sun protection: hats, long sleeves and the use of sunscreen if there is significant sun exposure.  No new identified risk were noted.  No failures at ADL's or IADL's.    BMI- discussed the importance of a healthy diet, water intake and the benefits of aerobic exercise. Educational material provided.   24  hour diet recall: Regular diet  Dental- UTD.   Sleep patterns- Sleeps well at night.   TDAP vaccine deferred per patient preference.  Follow up with insurance.  Educational material provided.  Colonoscopy discussed. She plans to have this done in December.   Patient Concerns: None at this time. Follow up with PCP as needed.  Hearing/Vision screen  Visual Acuity Screening   Right eye Left eye Both eyes  Without correction:   20/200  With correction:   20/20  Hearing Screening Comments: Patient is able to hear conversational tones without difficulty.  No issues reported.   Dietary issues and  exercise activities discussed: Current Exercise Habits: Home exercise routine, Type of exercise: walking, Time (Minutes): 20, Frequency (Times/Week): 4, Weekly Exercise (Minutes/Week): 80, Intensity: Mild  Goals    . Healthy Lifestyle     Stay hydrated Stay active       Depression Screen PHQ 2/9 Scores 11/17/2017 02/16/2017  PHQ - 2 Score 0 0    Fall Risk Fall Risk  11/17/2017 02/16/2017  Falls in the past year? 0 No   Cognitive Function: MMSE - Mini Mental State Exam 11/17/2017  Orientation to time 5  Orientation to Place 5  Registration 3  Attention/ Calculation 5  Recall 3  Language- name 2 objects 2  Language- repeat 1  Language- follow 3 step command 3  Language- read & follow direction 1  Write a sentence 1  Copy design 1  Total score 30        Screening Tests Health Maintenance  Topic Date Due  . TETANUS/TDAP  03/09/1968  . COLONOSCOPY  03/10/1999  . MAMMOGRAM  07/30/2019  . INFLUENZA VACCINE  Completed  . DEXA SCAN  Completed  . Hepatitis C Screening  Completed  . PNA vac Low Risk Adult  Completed     Plan:    End of life planning; Advance aging; Advanced directives discussed. Copy of current HCPOA/Living Will requested upon completion.    I have personally reviewed and noted the following in the patient's chart:   . Medical and social history . Use of  alcohol, tobacco or illicit drugs  . Current medications and supplements . Functional ability and status . Nutritional status . Physical activity . Advanced directives . List of other physicians . Hospitalizations, surgeries, and ER visits in previous 12 months . Vitals . Screenings to include cognitive, depression, and falls . Referrals and appointments  In addition, I have reviewed and discussed with patient certain preventive protocols, quality metrics, and best practice recommendations. A written personalized care plan for preventive services as well as general preventive health recommendations were provided to patient.     Varney Biles, LPN   [id number]/[id number]

## 2017-11-17 NOTE — Progress Notes (Signed)
Agree   TMS 

## 2017-11-17 NOTE — Patient Instructions (Addendum)
Shingrix vaccine    Chronic Obstructive Pulmonary Disease Chronic obstructive pulmonary disease (COPD) is a long-term (chronic) condition that affects the lungs. COPD is a general term that can be used to describe many different lung problems that cause lung swelling (inflammation) and limit airflow, including chronic bronchitis and emphysema. If you have COPD, your lung function will probably never return to normal. In most cases, it gets worse over time. However, there are steps you can take to slow the progression of the disease and improve your quality of life. What are the causes? This condition may be caused by:  Smoking. This is the most common cause.  Certain genes passed down through families.  What increases the risk? The following factors may make you more likely to develop this condition:  Secondhand smoke from cigarettes, pipes, or cigars.  Exposure to chemicals and other irritants such as fumes and dust in the work environment.  Chronic lung conditions or infections.  What are the signs or symptoms? Symptoms of this condition include:  Shortness of breath, especially during physical activity.  Chronic cough with a large amount of thick mucus. Sometimes the cough may not have any mucus (dry cough).  Wheezing.  Rapid breaths.  Gray or bluish discoloration (cyanosis) of the skin, especially in your fingers, toes, or lips.  Feeling tired (fatigue).  Weight loss.  Chest tightness.  Frequent infections.  Episodes when breathing symptoms become much worse (exacerbations).  Swelling in the ankles, feet, or legs. This may occur in later stages of the disease.  How is this diagnosed? This condition is diagnosed based on:  Your medical history.  A physical exam.  You may also have tests, including:  Lung (pulmonary) function tests. This may include a spirometry test, which measures your ability to exhale properly.  Chest X-ray.  CT scan.  Blood  tests.  How is this treated? This condition may be treated with:  Medicines. These may include inhaled rescue medicines to treat acute exacerbations as well as long-term, or maintenance, medicines to prevent flare-ups of COPD. ? Bronchodilators help treat COPD by dilating the airways to allow increased airflow and make your breathing more comfortable. ? Steroids can reduce airway inflammation and help prevent exacerbations.  Smoking cessation. If you smoke, your health care provider may ask you to quit, and may also recommend therapy or replacement products to help you quit.  Pulmonary rehabilitation. This may involve working with a team of health care providers and specialists, such as respiratory, occupational, and physical therapists.  Exercise and physical activity. These are beneficial for nearly all people with COPD.  Nutrition therapy to gain weight, if you are underweight.  Oxygen. Supplemental oxygen therapy is only helpful if you have a low oxygen level in your blood (hypoxemia).  Lung surgery or transplant.  Palliative care. This is to help people with COPD feel comfortable when treatment is no longer working.  Follow these instructions at home: Medicines  Take over-the-counter and prescription medicines (inhaled or pills) only as told by your health care provider.  Talk to your health care provider before taking any cough or allergy medicines. You may need to avoid certain medicines that dry out your airways. Lifestyle  If you are a smoker, the most important thing that you can do is to stop smoking. Do not use any products that contain nicotine or tobacco, such as cigarettes and e-cigarettes. If you need help quitting, ask your health care provider. Continuing to smoke will cause the disease to  progress faster.  Avoid exposure to things that irritate your lungs, such as smoke, chemicals, and fumes.  Stay active, but balance activity with periods of rest. Exercise and  physical activity will help you maintain your ability to do things you want to do.  Learn and use relaxation techniques to manage stress and to control your breathing.  Get the right amount of sleep and get quality sleep. Most adults need 7 or more hours per night.  Eat healthy foods. Eating smaller, more frequent meals and resting before meals may help you maintain your strength. Controlled breathing Learn and use controlled breathing techniques as directed by your health care provider. Controlled breathing techniques include:  Pursed lip breathing. Start by breathing in (inhaling) through your nose for 1 second. Then, purse your lips as if you were going to whistle and breathe out (exhale) through the pursed lips for 2 seconds.  Diaphragmatic breathing. Start by putting one hand on your abdomen just above your waist. Inhale slowly through your nose. The hand on your abdomen should move out. Then purse your lips and exhale slowly. You should be able to feel the hand on your abdomen moving in as you exhale.  Controlled coughing Learn and use controlled coughing to clear mucus from your lungs. Controlled coughing is a series of short, progressive coughs. The steps of controlled coughing are: 1. Lean your head slightly forward. 2. Breathe in deeply using diaphragmatic breathing. 3. Try to hold your breath for 3 seconds. 4. Keep your mouth slightly open while coughing twice. 5. Spit any mucus out into a tissue. 6. Rest and repeat the steps once or twice as needed.  General instructions  Make sure you receive all the vaccines that your health care provider recommends, especially the pneumococcal and influenza vaccines. Preventing infection and hospitalization is very important when you have COPD.  Use oxygen therapy and pulmonary rehabilitation if directed to by your health care provider. If you require home oxygen therapy, ask your health care provider whether you should purchase a pulse  oximeter to measure your oxygen level at home.  Work with your health care provider to develop a COPD action plan. This will help you know what steps to take if your condition gets worse.  Keep other chronic health conditions under control as told by your health care provider.  Avoid extreme temperature and humidity changes.  Avoid contact with people who have an illness that spreads from person to person (is contagious), such as viral infections or pneumonia.  Keep all follow-up visits as told by your health care provider. This is important. Contact a health care provider if:  You are coughing up more mucus than usual.  There is a change in the color or thickness of your mucus.  Your breathing is more labored than usual.  Your breathing is faster than usual.  You have difficulty sleeping.  You need to use your rescue medicines or inhalers more often than expected.  You have trouble doing routine activities such as getting dressed or walking around the house. Get help right away if:  You have shortness of breath while you are resting.  You have shortness of breath that prevents you from: ? Being able to talk. ? Performing your usual physical activities.  You have chest pain lasting longer than 5 minutes.  Your skin color is more blue (cyanotic) than usual.  You measure low oxygen saturations for longer than 5 minutes with a pulse oximeter.  You have a fever.  You feel too tired to breathe normally. Summary  Chronic obstructive pulmonary disease (COPD) is a long-term (chronic) condition that affects the lungs.  Your lung function will probably never return to normal. In most cases, it gets worse over time. However, there are steps you can take to slow the progression of the disease and improve your quality of life.  Treatment for COPD may include taking medicines, quitting smoking, pulmonary rehabilitation, and changes to diet and exercise. As the disease progresses,  you may need oxygen therapy, a lung transplant, or palliative care.  To help manage your condition, do not smoke, avoid exposure to things that irritate your lungs, stay up to date on all vaccines, and follow your health care provider's instructions for taking medicines. This information is not intended to replace advice given to you by your health care provider. Make sure you discuss any questions you have with your health care provider. Document Released: 10/08/2004 Document Revised: 02/03/2016 Document Reviewed: 02/03/2016 Elsevier Interactive Patient Education  2018 [employer].  Coronary Artery Disease, Female Coronary artery disease (CAD) is a condition in which the arteries that lead to the heart (coronary arteries) become narrow or blocked. The narrowing or blockage can lead to decreased blood flow to the heart. Prolonged reduced blood flow can cause a heart attack (myocardial infarction or MI). This condition may also be called coronary heart disease. Because CAD is the leading cause of death in women, it is important to understand what causes this condition and how it is treated. What are the causes? CAD is most often caused by atherosclerosis. This is the buildup of fat and cholesterol (plaque) on the inside of the arteries. Over time, the plaque may narrow or block the artery, reducing blood flow to the heart. Plaque can also become weak and break off within a coronary artery and cause a sudden blockage. Other less common causes of CAD include:  An embolism or blood clot in a coronary artery.  A tearing of the artery (spontaneous coronary artery dissection).  An aneurysm.  Inflammation (vasculitis) in the artery wall.  What increases the risk? The following factors may make you more likely to develop this condition:  Age. Women over age 44 are at a greater risk of CAD.  Family history of CAD.  High blood pressure (hypertension).  Diabetes.  High cholesterol  levels.  Tobacco use.  Lack of exercise.  Menopause. ? All postmenopausal women are at greater risk of CAD. ? Women who have experienced menopause between the ages of 4-45 (early menopause) are at a higher risk of CAD. ? Women who have experienced menopause before age 20 (premature menopause) are at a very high risk of CAD.  Excessive alcohol use  A diet high in saturated and trans fats, such as fried food and processed meat.  Other possible risk factors include:  High stress levels.  Depression  Obesity.  Sleep apnea.  What are the signs or symptoms? Many people do not have any symptoms during the early stages of CAD. As the condition progresses, symptoms may include:  Chest pain (angina). The pain can: ? Feel like crushing or squeezing, or a tightness, pressure, fullness, or heaviness in the chest. ? Last more than a few minutes or can stop and recur. The pain tends to get worse with exercise or stress and to fade with rest.  Pain in the arms, neck, jaw, or back.  Unexplained heartburn or indigestion.  Shortness of breath.  Nausea.  Sudden cold sweats.  Sudden light-headedness.  Fluttering or fast heartbeat (palpitations).  Many women have chest discomfort and the other symptoms. However, women often have unusual (atypical) symptoms, such as:  Fatigue.  Vomiting.  Unexplained feelings of nervousness or anxiety.  Unexplained weakness.  Dizziness or fainting.  How is this diagnosed? This condition is diagnosed based on:  Your family and medical history.  A physical exam.  Tests, including: ? A test to check the electrical signals in your heart (electrocardiogram). ? Exercise stress test. This looks for signs of blockage when the heart is stressed with exercise, such as running on a treadmill. ? Pharmacologic stress test. This test looks for signs of blockage when the heart is being stressed with a medicine. ? Blood tests. ? Coronary angiogram.  This is a procedure to look at the coronary arteries to see if there is any blockage. During this test, a dye is injected into your arteries so they appear on an X-ray. ? A test that uses sound waves to take a picture of your heart (echocardiogram). ? Chest X-ray.  How is this treated? This condition may be treated by:  Healthy lifestyle changes to reduce risk factors.  Medicines such as: ? Antiplatelet medicines and blood-thinning medicines, such as aspirin. These help prevent blood clots. ? Nitroglycerin. ? Blood pressure medicines. ? Cholesterol-lowering medicine.  Coronary angioplasty and stenting. During this procedure, a thin, flexible tube is inserted through a blood vessel and into a blocked artery. A balloon or similar device on the end of the tube is inflated to open up the artery. In some cases, a small, mesh tube (stent) is inserted into the artery to keep it open.  Coronary artery bypass surgery. During this surgery, veins or arteries from other parts of the body are used to create a bypass around the blockage and allow blood to reach your heart.  Follow these instructions at home: Medicines  Take over-the-counter and prescription medicines only as told by your health care provider.  Do not take the following medicines unless your health care provider approves: ? NSAIDs, such as ibuprofen, naproxen, or celecoxib. ? Vitamin supplements that contain vitamin A, vitamin E, or both. ? Hormone replacement therapy that contains estrogen with or without progestin. Lifestyle  Follow an exercise program approved by your health care provider. Aim for 150 minutes of moderate exercise or 75 minutes of vigorous exercise each week.  Maintain a healthy weight or lose weight as approved by your health care provider.  Rest when you are tired.  Learn to manage stress or try to limit your stress. Ask your health care provider for suggestions if you need help.  Get screened for  depression and seek treatment, if needed.  Do not use any products that contain nicotine or tobacco, such as cigarettes and e-cigarettes. If you need help quitting, ask your health care provider.  Do not use illegal drugs. Eating and drinking  Follow a heart-healthy diet. A dietitian can help educate you about healthy food options and changes. In general, eat plenty of fruits and vegetables, lean meats, and whole grains.  Avoid foods high in: ? Sugar. ? Salt (sodium). ? Saturated fats, such as processed or fatty meat. ? Trans fats, such as fried food.  Use healthy cooking methods such as roasting, grilling, broiling, baking, poaching, steaming, or stir-frying.  If you drink alcohol, and your health care provider approves, limit your alcohol intake to no more than 1 drink per day. One drink equals 12 ounces of beer,  5 ounces of wine, or 1 ounces of hard liquor. General instructions  Manage any other health conditions, such as hypertension and diabetes. These conditions affect your heart.  Your health care provider may ask you to monitor your blood pressure. Ideally, your blood pressure should be below 130/80.  Keep all follow-up visits as told by your health care provider. This is important. Get help right away if:  You have pain in your chest, neck, arm, jaw, stomach, or back that: ? Lasts more than a few minutes. ? Is recurring. ? Is not relieved by taking medicine under your tongue (sublingualnitroglycerin).  You have profuse sweating without cause.  You have unexplained: ? Heartburn or indigestion. ? Shortness of breath or difficulty breathing. ? Fluttering or fast heartbeat (palpitations). ? Nausea or vomiting. ? Fatigue. ? Feelings of nervousness or anxiety. ? Weakness. ? Diarrhea.  You have sudden light-headedness or dizziness.  You faint.  You feel like hurting yourself or think about taking your own life. These symptoms may represent a serious problem that is  an emergency. Do not wait to see if the symptoms will go away. Get medical help right away. Call your local emergency services (911 in the [place] Do not drive yourself to the hospital. Summary  Coronary artery disease (CAD) is a process in which the arteries that lead to the heart (coronary arteries) become narrow or blocked. The narrowing or blockage can lead to a heart attack.  Many women have chest discomfort and other common symptoms of CAD. However, women often have different (atypical) symptoms, such as fatigue, vomiting, and dizziness or weakness.  CAD can be treated with lifestyle changes, medicines, surgery, or a combination of these treatments. This information is not intended to replace advice given to you by your health care provider. Make sure you discuss any questions you have with your health care provider. Document Released: 03/23/2011 Document Revised: 12/20/2015 Document Reviewed: 12/20/2015 Elsevier Interactive Patient Education  2018 [employer] Zoster (Shingles) Vaccine, RZV: What You Need to Know 1. Why get vaccinated? Shingles (also called herpes zoster, or just zoster) is a painful skin rash, often with blisters. Shingles is caused by the varicella zoster virus, the same virus that causes chickenpox. After you have chickenpox, the virus stays in your body and can cause shingles later in life. You can't catch shingles from another person. However, a person who has never had chickenpox (or chickenpox vaccine) could get chickenpox from someone with shingles. A shingles rash usually appears on one side of the face or body and heals within 2 to 4 weeks. Its main symptom is pain, which can be severe. Other symptoms can include fever, headache, chills and upset stomach. Very rarely, a shingles infection can lead to pneumonia, hearing problems, blindness, brain inflammation (encephalitis), or death. For about 1 person in 5, severe pain can continue even long after  the rash has cleared up. This long-lasting pain is called post-herpetic neuralgia (PHN). Shingles is far more common in people 27 years of age and older than in younger people, and the risk increases with age. It is also more common in people whose immune system is weakened because of a disease such as cancer, or by drugs such as steroids or chemotherapy. At least 1 million people a year in the [place] get shingles. 2. Shingles vaccine (recombinant) Recombinant shingles vaccine was approved by FDA in 2017 for the prevention of shingles. In clinical trials, it was more than 90% effective in  preventing shingles. It can also reduce the likelihood of PHN. Two doses, 2 to 6 months apart, are recommended for adults 80 and older. This vaccine is also recommended for people who have already gotten the live shingles vaccine (Zostavax). There is no live virus in this vaccine. 3. Some people should not get this vaccine Tell your vaccine provider if you:  Have any severe, life-threatening allergies. A person who has ever had a life-threatening allergic reaction after a dose of recombinant shingles vaccine, or has a severe allergy to any component of this vaccine, may be advised not to be vaccinated. Ask your health care provider if you want information about vaccine components.  Are pregnant or breastfeeding. There is not much information about use of recombinant shingles vaccine in pregnant or nursing women. Your healthcare provider might recommend delaying vaccination.  Are not feeling well. If you have a mild illness, such as a cold, you can probably get the vaccine today. If you are moderately or severely ill, you should probably wait until you recover. Your doctor can advise you.  4. Risks of a vaccine reaction With any medicine, including vaccines, there is a chance of reactions. After recombinant shingles vaccination, a person might experience:  Pain, redness, soreness, or swelling at the site  of the injection  Headache, muscle aches, fever, shivering, fatigue  In clinical trials, most people got a sore arm with mild or moderate pain after vaccination, and some also had redness and swelling where they got the shot. Some people felt tired, had muscle pain, a headache, shivering, fever, stomach pain, or nausea. About 1 out of 6 people who got recombinant zoster vaccine experienced side effects that prevented them from doing regular activities. Symptoms went away on their own in about 2 to 3 days. Side effects were more common in younger people. You should still get the second dose of recombinant zoster vaccine even if you had one of these reactions after the first dose. Other things that could happen after this vaccine:  People sometimes faint after medical procedures, including vaccination. Sitting or lying down for about 15 minutes can help prevent fainting and injuries caused by a fall. Tell your provider if you feel dizzy or have vision changes or ringing in the ears.  Some people get shoulder pain that can be more severe and longer-lasting than routine soreness that can follow injections. This happens very rarely.  Any medication can cause a severe allergic reaction. Such reactions to a vaccine are estimated at about 1 in a million doses, and would happen within a few minutes to a few hours after the vaccination. As with any medicine, there is a very remote chance of a vaccine causing a serious injury or death. The safety of vaccines is always being monitored. For more information, visit: http://www.aguilar.org/ 5. What if there is a serious problem? What should I look for?  Look for anything that concerns you, such as signs of a severe allergic reaction, very high fever, or unusual behavior. Signs of a severe allergic reaction can include hives, swelling of the face and throat, difficulty breathing, a fast heartbeat, dizziness, and weakness. These would usually start a few  minutes to a few hours after the vaccination. What should I do?  If you think it is a severe allergic reaction or other emergency that can't wait, call 9-1-1 and get to the nearest hospital. Otherwise, call your health care provider. Afterward, the reaction should be reported to the Vaccine Adverse Event Reporting  System (VAERS). Your doctor should file this report, or you can do it yourself through the VAERS web site atwww.vaers.https://www.bray.com/ by calling 321-117-0050. VAERS does not give medical advice. 6. How can I learn more?  Ask your healthcare provider. He or she can give you the vaccine package insert or suggest other sources of information.  Call your local or state health department.  Contact the Centers for Disease Control and Prevention (CDC): ? Call 226-716-4229 (1-800-CDC-INFO) or ? Visit the CDC's website at http://hunter.com/ CDC Vaccine Information Statement (VIS) Recombinant Zoster Vaccine (02/24/2016) This information is not intended to replace advice given to you by your health care provider. Make sure you discuss any questions you have with your health care provider. Document Released: 03/10/2016 Document Revised: 03/10/2016 Document Reviewed: 03/10/2016 Elsevier Interactive Patient Education  [employer].

## 2017-11-17 NOTE — Progress Notes (Signed)
Agree with below   TMS 

## 2017-11-17 NOTE — Progress Notes (Signed)
Chief Complaint  Patient presents with  . Follow-up   F/u  1. HLD on lipitor 20 mg qhs  2. Still smoking will state how much but reports cut back smoking less stopped Trelegy but encouraged pt to use as maintenance    Review of Systems  Constitutional: Negative for weight loss.  HENT: Negative for hearing loss.   Eyes: Negative for blurred vision.  Respiratory: Negative for shortness of breath.   Cardiovascular: Negative for chest pain.  Gastrointestinal: Negative for abdominal pain.  Musculoskeletal: Negative for falls.  Skin: Negative for rash.  Neurological: Negative for headaches.  Psychiatric/Behavioral: Negative for depression.   Past Medical History:  Diagnosis Date  . Allergy    cats   . Cancer ([place])    cervical  . Hyperlipidemia    Past Surgical History:  Procedure Laterality Date  . ABDOMINAL HYSTERECTOMY     1983   Family History  Problem Relation Age of Onset  . Cancer Mother        lung cancer smoker died 15  . Muscular dystrophy Father   . Muscular dystrophy Sister   . Muscular dystrophy Sister   . Breast cancer Neg Hx    Social History   Socioeconomic History  . Marital status: Widowed    Spouse name: Not on file  . Number of children: Not on file  . Years of education: Not on file  . Highest education level: Not on file  Occupational History  . Not on file  Social Needs  . Financial resource strain: Not hard at all  . Food insecurity:    Worry: Never true    Inability: Never true  . Transportation needs:    Medical: No    Non-medical: No  Tobacco Use  . Smoking status: Current Every Day Smoker    Packs/day: 0.70    Years: 46.00    Pack years: 32.20  . Smokeless tobacco: Never Used  Substance and Sexual Activity  . Alcohol use: No    Frequency: Never  . Drug use: No  . Sexual activity: Never  Lifestyle  . Physical activity:    Days per week: 4 days    Minutes per session: 20 min  . Stress: Not at all  Relationships  . Social  connections:    Talks on phone: Not on file    Gets together: Not on file    Attends religious service: Not on file    Active member of club or organization: Not on file    Attends meetings of clubs or organizations: Not on file    Relationship status: Not on file  . Intimate partner violence:    Fear of current or ex partner: Not on file    Emotionally abused: Not on file    Physically abused: Not on file    Forced sexual activity: Not on file  Other Topics Concern  . Not on file  Social History Narrative   Widowed    12 grade education    Current Meds  Medication Sig  . atorvastatin (LIPITOR) 20 MG tablet Take 1 tablet (20 mg total) by mouth daily at 6 PM.   Not on File No results found for this or any previous visit (from the past 2160 hour(s)). Objective  Body mass index is 22.93 kg/m. Wt Readings from Last 3 Encounters:  11/17/17 127 lb 6.4 oz (57.8 kg)  11/17/17 127 lb 6.4 oz (57.8 kg)  04/08/17 128 lb (58.1 kg)   Temp  Readings from Last 3 Encounters:  11/17/17 98.1 F (36.7 C) (Oral)  11/17/17 98.1 F (36.7 C) (Oral)  02/16/17 98 F (36.7 C) (Oral)   BP Readings from Last 3 Encounters:  11/17/17 124/64  11/17/17 124/64  02/16/17 134/70   Pulse Readings from Last 3 Encounters:  11/17/17 63  11/17/17 63  02/16/17 85    Physical Exam  Constitutional: She is oriented to person, place, and time. Vital signs are normal. She appears well-developed and well-nourished. She is cooperative.  HENT:  Head: Normocephalic and atraumatic.  Mouth/Throat: Oropharynx is clear and moist and mucous membranes are normal.  Eyes: Pupils are equal, round, and reactive to light. Conjunctivae are normal.  Cardiovascular: Normal rate, regular rhythm and normal heart sounds.  Pulmonary/Chest: Effort normal and breath sounds normal.  Neurological: She is alert and oriented to person, place, and time. Gait normal.  Skin: Skin is warm and dry.  Possibly Aks right check  Mid  forehead and left temple   Psychiatric: She has a normal mood and affect. Her speech is normal and behavior is normal. Judgment and thought content normal. Cognition and memory are normal.  Nursing note and vitals reviewed.   Assessment   1. HLD  2. 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 Tobacco abuse and copd  4. CAD in LAD on imaging no chest pain today  5. HM Plan   1. Check fasting labs today  Cont lipitor 20 mg qhs  2. Consider derm in face  3. rec smoking cessation  4. On aspirin 81 mg qd and statin  5.  Had flu shot had 09/24/17  Had pna 12 09/04/17  Tdap 05/25/12  prevnar may have had 06/22/16  zostervax 01/23/09 disc shingrix today given Rx  Declines MMR check Consider hep B vaccine Hep C neg   mammo neg 07/29/17 neg  Pap s/p hysterectomy had 1 nl years ago with ho abnormal; pap s/p hysterectomy nl per pt  Colonoscopy had [place] per pt review Dr. Harless Litten records  -referred today pt wants to go after 12/29/17 and before 01/2018  DEXA 04/2012 normal ? If others with Dr. Lavera Guise. DEXA 10/12/17 normal  +smoker rec cessation smoking 20-30 years h/o lung cancer in mother max 4 pks/week now 1-2 pks/week.  -CT scan due 04/09/2018 h/o lung nodule and copd noted in smoker   Provider: Dr. Olivia Mackie McLean-Scocuzza-Internal Medicine

## 2017-11-17 NOTE — Patient Instructions (Addendum)
Brittany Burch , Thank you for taking time to come for your Medicare Wellness Visit. I appreciate your ongoing commitment to your health goals. Please review the following plan we discussed and let me know if I can assist you in the future.   Follow up as needed.    Bring a copy of your [place] and/or Living Will to be scanned into chart.  Have a great day!  These are the goals we discussed: Goals    . Healthy Lifestyle     Stay hydrated Stay active        This is a list of the screening recommended for you and due dates:  Health Maintenance  Topic Date Due  . Tetanus Vaccine  03/09/1968  . Colon Cancer Screening  03/10/1999  . Mammogram  07/30/2019  . Flu Shot  Completed  . DEXA scan (bone density measurement)  Completed  .  Hepatitis C: One time screening is recommended by Center for Disease Control  (CDC) for  adults born from [birth year] through 1966.   Completed  . Pneumonia vaccines  Completed    Colonoscopy, Adult A colonoscopy is an exam to look at the entire large intestine. During the exam, a lubricated, bendable tube is inserted into the anus and then passed into the rectum, colon, and other parts of the large intestine. A colonoscopy is often done as a part of normal colorectal screening or in response to certain symptoms, such as anemia, persistent diarrhea, abdominal pain, and blood in the stool. The exam can help screen for and diagnose medical problems, including:  Tumors.  Polyps.  Inflammation.  Areas of bleeding.  Tell a health care provider about:  Any allergies you have.  All medicines you are taking, including vitamins, herbs, eye drops, creams, and over-the-counter medicines.  Any problems you or family members have had with anesthetic medicines.  Any blood disorders you have.  Any surgeries you have had.  Any medical conditions you have.  Any problems you have had passing stool. What are the risks? Generally, this is a  safe procedure. However, problems may occur, including:  Bleeding.  A tear in the intestine.  A reaction to medicines given during the exam.  Infection (rare).  What happens before the procedure? Eating and drinking restrictions Follow instructions from your health care provider about eating and drinking, which may include:  A few days before the procedure - follow a low-fiber diet. Avoid nuts, seeds, dried fruit, raw fruits, and vegetables.  1-3 days before the procedure - follow a clear liquid diet. Drink only clear liquids, such as clear broth or bouillon, black coffee or tea, clear juice, clear soft drinks or sports drinks, gelatin dessert, and popsicles. Avoid any liquids that contain red or purple dye.  On the day of the procedure - do not eat or drink anything during the 2 hours before the procedure, or within the time period that your health care provider recommends.  Bowel prep If you were prescribed an oral bowel prep to clean out your colon:  Take it as told by your health care provider. Starting the day before your procedure, you will need to drink a large amount of medicated liquid. The liquid will cause you to have multiple loose stools until your stool is almost clear or light green.  If your skin or anus gets irritated from diarrhea, you may use these to relieve the irritation: ? Medicated wipes, such as adult wet wipes with aloe  and vitamin E. ? A skin soothing-product like petroleum jelly.  If you vomit while drinking the bowel prep, take a break for up to 60 minutes and then begin the bowel prep again. If vomiting continues and you cannot take the bowel prep without vomiting, call your health care provider.  General instructions  Ask your health care provider about changing or stopping your regular medicines. This is especially important if you are taking diabetes medicines or blood thinners.  Plan to have someone take you home from the hospital or clinic. What  happens during the procedure?  An IV tube may be inserted into one of your veins.  You will be given medicine to help you relax (sedative).  To reduce your risk of infection: ? Your health care team will wash or sanitize their hands. ? Your anal area will be washed with soap.  You will be asked to lie on your side with your knees bent.  Your health care provider will lubricate a long, thin, flexible tube. The tube will have a camera and a light on the end.  The tube will be inserted into your anus.  The tube will be gently eased through your rectum and colon.  Air will be delivered into your colon to keep it open. You may feel some pressure or cramping.  The camera will be used to take images during the procedure.  A small tissue sample may be removed from your body to be examined under a microscope (biopsy). If any potential problems are found, the tissue will be sent to a lab for testing.  If small polyps are found, your health care provider may remove them and have them checked for cancer cells.  The tube that was inserted into your anus will be slowly removed. The procedure may vary among health care providers and hospitals. What happens after the procedure?  Your blood pressure, heart rate, breathing rate, and blood oxygen level will be monitored until the medicines you were given have worn off.  Do not drive for 24 hours after the exam.  You may have a small amount of blood in your stool.  You may pass gas and have mild abdominal cramping or bloating due to the air that was used to inflate your colon during the exam.  It is up to you to get the results of your procedure. Ask your health care provider, or the department performing the procedure, when your results will be ready. This information is not intended to replace advice given to you by your health care provider. Make sure you discuss any questions you have with your health care provider. Document Released:  12/27/1999 Document Revised: 10/30/2015 Document Reviewed: 03/12/2015 Elsevier Interactive Patient Education  2018 [employer].

## 2017-11-18 ENCOUNTER — Other Ambulatory Visit: Payer: MEDICARE

## 2017-11-18 DIAGNOSIS — R319 Hematuria, unspecified: Secondary | ICD-10-CM

## 2017-11-18 NOTE — Addendum Note (Signed)
Addended by: Isaiah Serge D on: 11/18/2017 11:25 AM   Modules accepted: Orders

## 2017-11-19 ENCOUNTER — Telehealth: Payer: Self-pay

## 2017-11-19 ENCOUNTER — Other Ambulatory Visit: Payer: Self-pay | Admitting: Internal Medicine

## 2017-11-19 DIAGNOSIS — E785 Hyperlipidemia, unspecified: Secondary | ICD-10-CM

## 2017-11-19 MED ORDER — ATORVASTATIN CALCIUM 40 MG PO TABS: 40.0000 mg | ORAL_TABLET | Freq: Every day | ORAL | 3 refills | Status: DC

## 2017-11-19 NOTE — Telephone Encounter (Signed)
Copied from [place] [phone]. Topic: General - Other >> Nov 19, 2017  4:03 PM Keene Breath wrote: Reason for CRM: Patient is returning call from Dr. Claris Gladden nurse.  Please call patient back.  CB# [phone]   **I spoke with patient & she just wanted to verify lab results. She wanted to make sure it was ok to take vitamin D in the morning & lipitor at night. She stated that if she had any UTI symptoms she would call our office back.**

## 2017-11-20 LAB — URINE CULTURE
MICRO NUMBER: 91342493
SPECIMEN QUALITY:: ADEQUATE

## 2017-11-22 ENCOUNTER — Other Ambulatory Visit: Payer: Self-pay | Admitting: Internal Medicine

## 2017-11-22 DIAGNOSIS — N3000 Acute cystitis without hematuria: Secondary | ICD-10-CM

## 2017-11-22 DIAGNOSIS — N3001 Acute cystitis with hematuria: Secondary | ICD-10-CM

## 2017-11-22 MED ORDER — CIPROFLOXACIN HCL 500 MG PO TABS: 500.0000 mg | ORAL_TABLET | Freq: Two times a day (BID) | ORAL | 0 refills | Status: DC

## 2017-11-23 ENCOUNTER — Telehealth: Payer: Self-pay

## 2017-11-23 NOTE — Telephone Encounter (Signed)
Copied from [place] [id number]. Topic: General - Other >> Nov 23, 2017 11:17 AM Yvette Rack wrote: Reason for CRM: Pt states she was advised by the pharmacist to complete the ciprofloxacin (CIPRO) 500 MG tablet and once she has finished the medication she can have her shingles vaccine. Cb# [phone]

## 2017-12-13 ENCOUNTER — Other Ambulatory Visit: Payer: MEDICARE

## 2017-12-13 DIAGNOSIS — N3001 Acute cystitis with hematuria: Secondary | ICD-10-CM

## 2017-12-13 NOTE — Addendum Note (Signed)
Addended by: Arby Barrette on: 12/13/2017 08:20 AM   Modules accepted: Orders

## 2017-12-14 LAB — URINALYSIS, ROUTINE W REFLEX MICROSCOPIC
BILIRUBIN UA: NEGATIVE
Glucose, UA: NEGATIVE
KETONES UA: NEGATIVE
Leukocytes, UA: NEGATIVE
NITRITE UA: NEGATIVE
Protein, UA: NEGATIVE
RBC UA: NEGATIVE
Specific Gravity, UA: 1.01 (ref 1.005–1.030)
UUROB: 0.2 mg/dL (ref 0.2–1.0)
pH, UA: 6 (ref 5.0–7.5)

## 2017-12-15 LAB — URINE CULTURE: Organism ID, Bacteria: NO GROWTH

## 2017-12-16 ENCOUNTER — Telehealth: Payer: Self-pay | Admitting: Internal Medicine

## 2017-12-16 NOTE — Telephone Encounter (Signed)
Left message on voicemail for pt to return call to office for results.    

## 2017-12-16 NOTE — Telephone Encounter (Signed)
Pt called back.  Copied from [place] [phone]. Topic: Quick Communication - Lab Results (Clinic Use ONLY) >> Dec 16, 2017 10:37 AM Babs Bertin, CMA wrote: Called patient to inform them of [phone] lab results. When patient returns call, triage nurse may disclose results.

## 2017-12-31 ENCOUNTER — Ambulatory Visit
Admission: RE | Admit: 2017-12-31 | Discharge: 2017-12-31 | Disposition: A | Discharge status: Home / Self Care | Payer: MEDICARE | Attending: Gastroenterology | Admitting: Gastroenterology

## 2017-12-31 ENCOUNTER — Ambulatory Visit: Payer: MEDICARE | Admitting: Anesthesiology

## 2017-12-31 ENCOUNTER — Encounter
Admission: RE | Disposition: A | Discharge status: Home / Self Care | Payer: Self-pay | Source: Home / Self Care | Attending: Gastroenterology

## 2017-12-31 ENCOUNTER — Other Ambulatory Visit: Payer: Self-pay

## 2017-12-31 DIAGNOSIS — I2510 Atherosclerotic heart disease of native coronary artery without angina pectoris: Secondary | ICD-10-CM | POA: Insufficient documentation

## 2017-12-31 DIAGNOSIS — Z79899 Other long term (current) drug therapy: Secondary | ICD-10-CM | POA: Diagnosis not present

## 2017-12-31 DIAGNOSIS — Z8541 Personal history of malignant neoplasm of cervix uteri: Secondary | ICD-10-CM | POA: Diagnosis not present

## 2017-12-31 DIAGNOSIS — Z7982 Long term (current) use of aspirin: Secondary | ICD-10-CM | POA: Insufficient documentation

## 2017-12-31 DIAGNOSIS — E785 Hyperlipidemia, unspecified: Secondary | ICD-10-CM | POA: Diagnosis not present

## 2017-12-31 DIAGNOSIS — F17210 Nicotine dependence, cigarettes, uncomplicated: Secondary | ICD-10-CM | POA: Diagnosis not present

## 2017-12-31 DIAGNOSIS — J449 Chronic obstructive pulmonary disease, unspecified: Secondary | ICD-10-CM | POA: Diagnosis not present

## 2017-12-31 DIAGNOSIS — Z7951 Long term (current) use of inhaled steroids: Secondary | ICD-10-CM | POA: Insufficient documentation

## 2017-12-31 DIAGNOSIS — Z1211 Encounter for screening for malignant neoplasm of colon: Secondary | ICD-10-CM | POA: Insufficient documentation

## 2017-12-31 HISTORY — PX: COLONOSCOPY WITH PROPOFOL: SHX5780

## 2017-12-31 SURGERY — COLONOSCOPY WITH PROPOFOL
Anesthesia: General

## 2017-12-31 MED ORDER — PROPOFOL 500 MG/50ML IV EMUL
INTRAVENOUS | Status: DC | PRN
  Administered 2017-12-31: 130 ug/kg/min via INTRAVENOUS

## 2017-12-31 MED ORDER — PROPOFOL 500 MG/50ML IV EMUL
INTRAVENOUS | Status: AC
  Filled 2017-12-31: qty 50

## 2017-12-31 MED ORDER — SODIUM CHLORIDE 0.9 % IV SOLN
INTRAVENOUS | Status: DC
  Administered 2017-12-31: 07:00:00 via INTRAVENOUS

## 2017-12-31 MED ORDER — ACETAMINOPHEN 500 MG PO TABS
ORAL_TABLET | ORAL | Status: AC
  Filled 2017-12-31: qty 2

## 2017-12-31 NOTE — Anesthesia Preprocedure Evaluation (Signed)
Anesthesia Evaluation  Patient identified by MRN, date of birth, ID band Patient awake    Reviewed: Allergy & Precautions, NPO status , Patient's Chart, lab work & pertinent test results  Airway Mallampati: III       Dental   Pulmonary COPD, Current Smoker,    Pulmonary exam normal        Cardiovascular + CAD  Normal cardiovascular exam     Neuro/Psych negative neurological ROS  negative psych ROS   GI/Hepatic negative GI ROS, Neg liver ROS,   Endo/Other  negative endocrine ROS  Renal/GU negative Renal ROS  negative genitourinary   Musculoskeletal negative musculoskeletal ROS (+)   Abdominal Normal abdominal exam  (+)   Peds negative pediatric ROS (+)  Hematology negative hematology ROS (+)   Anesthesia Other Findings   Reproductive/Obstetrics                             Anesthesia Physical Anesthesia Plan  ASA: III  Anesthesia Plan: General   Post-op Pain Management:    Induction: Intravenous  PONV Risk Score and Plan:   Airway Management Planned: Nasal Cannula  Additional Equipment:   Intra-op Plan:   Post-operative Plan:   Informed Consent: I have reviewed the patients History and Physical, chart, labs and discussed the procedure including the risks, benefits and alternatives for the proposed anesthesia with the patient or authorized representative who has indicated his/her understanding and acceptance.   Dental advisory given  Plan Discussed with: CRNA and Surgeon  Anesthesia Plan Comments:         Anesthesia Quick Evaluation

## 2017-12-31 NOTE — Op Note (Signed)
[facility] Patient Name: Brittany Burch Procedure Date: 12/31/2017 8:08 AM MRN: [id number] Account #: [id number] Date of Birth: 01/27/1950 Admit Type: Outpatient Age: 67 Room: [facility] ENDO ROOM 2 Gender: Female Note Status: Finalized Procedure:            Colonoscopy Indications:          Screening for colorectal malignant neoplasm, Previous                        procedure was 10 years ago as per patient and was                        normal. As per PCP notes, previous procedure was in                        2004. Colonoscopy report not available to me. Providers:            Lennette Bihari. Bonna Gains MD, MD Referring MD:         Nino Glow Mclean-Scocuzza MD, MD (Referring MD) Medicines:            Monitored Anesthesia Care Complications:        No immediate complications. Procedure:            Pre-Anesthesia Assessment:                       - Prior to the procedure, a History and Physical was                        performed, and patient medications, allergies and                        sensitivities were reviewed. The patient's tolerance of                        previous anesthesia was reviewed.                       - The risks and benefits of the procedure and the                        sedation options and risks were discussed with the                        patient. All questions were answered and informed                        consent was obtained.                       - Patient identification and proposed procedure were                        verified prior to the procedure by the physician, the                        nurse, the anesthetist and the technician. The                        procedure was verified in the pre-procedure area in the  procedure room in the endoscopy suite.                       - ASA Grade Assessment: II - A patient with mild                        systemic disease.                       -  After reviewing the risks and benefits, the patient                        was deemed in satisfactory condition to undergo the                        procedure.                       After obtaining informed consent, the colonoscope was                        passed under direct vision. Throughout the procedure,                        the patient's blood pressure, pulse, and oxygen                        saturations were monitored continuously. The                        Colonoscope was introduced through the anus and                        advanced to the the cecum, identified by appendiceal                        orifice and ileocecal valve. The colonoscopy was                        performed with ease. The patient tolerated the                        procedure well. The quality of the bowel preparation                        was good. Findings:      The perianal and digital rectal examinations were normal.      The rectum, sigmoid colon, descending colon, transverse colon, ascending       colon and cecum appeared normal.      The retroflexed view of the distal rectum and anal verge was normal and       showed no anal or rectal abnormalities. Impression:           - The rectum, sigmoid colon, descending colon,                        transverse colon, ascending colon and cecum are normal.                       - The distal rectum and anal verge are normal on  retroflexion view.                       - No specimens collected. Recommendation:       - Discharge patient to home.                       - Resume previous diet.                       - Continue present medications.                       - Repeat colonoscopy in 10 years for screening purposes.                       - Return to primary care physician as previously                        scheduled.                       - The findings and recommendations were discussed with                        the  patient.                       - The findings and recommendations were discussed with                        the patient's family.                       - In the future, if patient develops new symptoms such                        as blood per rectum, abdominal pain, weight loss,                        altered bowel habits or any other reason for concern,                        patient should discuss this with her PCP as she may                        need a GI referral at that time or evaluation for need                        for colonoscopy earlier than her recommended screening                        colonoscopy.                       In addition, if patient's family history of colon                        cancer changes (no family history at this time) in the                        future, earlier screening may be indicated and patient  should discuss this with PCP as well. Procedure Code(s):    --- Professional ---                       V4008, Colorectal cancer screening; colonoscopy on                        individual not meeting criteria for high risk Diagnosis Code(s):    --- Professional ---                       Z12.11, Encounter for screening for malignant neoplasm                        of colon CPT copyright 2018 American Medical Association. All rights reserved. The codes documented in this report are preliminary and upon coder review may  be revised to meet current compliance requirements.  Vonda Antigua, MD Margretta Sidle B. Bonna Gains MD, MD 12/31/2017 8:54:12 AM This report has been signed electronically. Number of Addenda: 0 Note Initiated On: 12/31/2017 8:08 AM Scope Withdrawal Time: 0 hours 13 minutes 36 seconds  Total Procedure Duration: 0 hours 22 minutes 13 seconds       [facility]

## 2017-12-31 NOTE — Anesthesia Post-op Follow-up Note (Signed)
Anesthesia QCDR form completed.        

## 2017-12-31 NOTE — Transfer of Care (Signed)
Immediate Anesthesia Transfer of Care Note  Patient: Brittany Burch  Procedure(s) Performed: COLONOSCOPY WITH PROPOFOL (N/A )  Patient Location: PACU and Endoscopy Unit  Anesthesia Type:General  Level of Consciousness: awake, alert  and oriented  Airway & Oxygen Therapy: Patient Spontanous Breathing  Post-op Assessment: Report given to RN  Post vital signs: Reviewed and stable  Last Vitals:  Vitals Value Taken Time  BP    Temp 36.1 C 12/31/2017  8:52 AM  Pulse 87 12/31/2017  8:52 AM  Resp 21 12/31/2017  8:52 AM  SpO2 100 % 12/31/2017  8:52 AM    Last Pain:  Vitals:   12/31/17 0852  TempSrc: Tympanic  PainSc: 0-No pain         Complications: No apparent anesthesia complications

## 2017-12-31 NOTE — H&P (Signed)
Vonda Antigua, MD [address] [place], [place], [place], [address] [id number], [place], [place], [place], [address] Phone: [phone]  Fax: [phone]  Primary Care Physician:  McLean-Scocuzza, Nino Glow, MD   Pre-Procedure History & Physical: HPI:  Brittany Burch is a 67 y.o. female is here for a colonoscopy.   Past Medical History:  Diagnosis Date  . Allergy    cats   . Cancer ([place])    cervical  . Hyperlipidemia     Past Surgical History:  Procedure Laterality Date  . ABDOMINAL HYSTERECTOMY     1983    Prior to Admission medications   Medication Sig Start Date End Date Taking? Authorizing Provider  Fluticasone-Umeclidin-Vilant (TRELEGY ELLIPTA) 100-62.5-25 MCG/INH AEPB Inhale into the lungs.   Yes [provider]  aspirin EC 81 MG tablet Take 81 mg by mouth daily.    [provider]  atorvastatin (LIPITOR) 40 MG tablet Take 1 tablet (40 mg total) by mouth daily at 6 PM. 11/19/17   McLean-Scocuzza, Nino Glow, MD  ciprofloxacin (CIPRO) 500 MG tablet Take 1 tablet (500 mg total) by mouth 2 (two) times daily. Patient not taking: Reported on 12/31/2017 11/22/17   McLean-Scocuzza, Nino Glow, MD    Allergies as of 11/17/2017  . (No Known Allergies)    Family History  Problem Relation Age of Onset  . Cancer Mother        lung cancer smoker died 85  . Muscular dystrophy Father   . Muscular dystrophy Sister   . Muscular dystrophy Sister   . Breast cancer Neg Hx     Social History   Socioeconomic History  . Marital status: Widowed    Spouse name: Not on file  . Number of children: Not on file  . Years of education: Not on file  . Highest education level: Not on file  Occupational History  . Not on file  Social Needs  . Financial resource strain: Not hard at all  . Food insecurity:    Worry: Never true    Inability: Never true  . Transportation needs:    Medical: No    Non-medical: No  Tobacco Use  . Smoking status: Current  Every Day Smoker    Packs/day: 0.70    Years: 46.00    Pack years: 32.20  . Smokeless tobacco: Never Used  Substance and Sexual Activity  . Alcohol use: No    Frequency: Never  . Drug use: No  . Sexual activity: Never  Lifestyle  . Physical activity:    Days per week: 4 days    Minutes per session: 20 min  . Stress: Not at all  Relationships  . Social connections:    Talks on phone: Not on file    Gets together: Not on file    Attends religious service: Not on file    Active member of club or organization: Not on file    Attends meetings of clubs or organizations: Not on file    Relationship status: Not on file  . Intimate partner violence:    Fear of current or ex partner: Not on file    Emotionally abused: Not on file    Physically abused: Not on file    Forced sexual activity: Not on file  Other Topics Concern  . Not on file  Social History Narrative   Widowed    12 grade education     Review of Systems: See HPI, otherwise negative ROS  Physical  Exam: BP (!) 154/91   Pulse 91   Temp (!) 96 F (35.6 C) (Tympanic)   Resp 18   Ht 5\' 3"  (1.6 m)   Wt 57.2 kg   SpO2 97%   BMI 22.32 kg/m  General:   Alert,  pleasant and cooperative in NAD Head:  Normocephalic and atraumatic. Neck:  Supple; no masses or thyromegaly. Lungs:  Clear throughout to auscultation, normal respiratory effort.    Heart:  +S1, +S2, Regular rate and rhythm, No edema. Abdomen:  Soft, nontender and nondistended. Normal bowel sounds, without guarding, and without rebound.   Neurologic:  Alert and  oriented x4;  grossly normal neurologically.  Impression/Plan: Brittany Burch is here for a colonoscopy to be performed for average risk screening.  Risks, benefits, limitations, and alternatives regarding  colonoscopy have been reviewed with the patient.  Questions have been answered.  All parties agreeable.   Virgel Manifold, MD  12/31/2017, 8:10 AM

## 2017-12-31 NOTE — Anesthesia Postprocedure Evaluation (Signed)
Anesthesia Post Note  Patient: Brittany Burch  Procedure(s) Performed: COLONOSCOPY WITH PROPOFOL (N/A )  Patient location during evaluation: Endoscopy Anesthesia Type: General Level of consciousness: awake and alert and oriented Pain management: pain level controlled Vital Signs Assessment: post-procedure vital signs reviewed and stable Respiratory status: spontaneous breathing Cardiovascular status: blood pressure returned to baseline Anesthetic complications: no     Last Vitals:  Vitals:   12/31/17 0709 12/31/17 0852  BP: (!) 154/91   Pulse: 91 87  Resp: 18 (!) 21  Temp: (!) 35.6 C (!) 36.1 C  SpO2: 97% 100%    Last Pain:  Vitals:   12/31/17 0852  TempSrc: Tympanic  PainSc: 0-No pain                 Garwood Wentzell

## 2018-01-11 ENCOUNTER — Telehealth: Payer: Self-pay

## 2018-01-11 NOTE — Telephone Encounter (Signed)
Copied from [place] [id number]. Topic: General - Inquiry >> Jan 11, 2018  8:38 AM Vernona Rieger wrote: Reason for CRM: Patient said that she might be chosen to be on [employer]. She said that they told you can not go to the bathroom for two hours and she said she coughs really bad and can not even have water in the court room. She is asking if she can be excused by Dr Aundra Dubin, she has a paper and would like to bring it by the office. She has to turn to the paper back into the court by January 7th, 2020. Please Advise

## 2018-01-11 NOTE — Telephone Encounter (Signed)
Please bring in form Thursday or Friday of this week Call the pt  We disc'ed her bladder incontinence sx's 02/16/17   [place]

## 2018-01-11 NOTE — Telephone Encounter (Signed)
Please advise 

## 2018-01-13 NOTE — Telephone Encounter (Signed)
paperwork has been given to PCP

## 2018-01-13 NOTE — Telephone Encounter (Signed)
Pt dropped off Excuse for jury duty to be filled out. Placed in Dr. Olivia Mackie colored folder upfront. Please advise pt when completed

## 2018-01-14 ENCOUNTER — Encounter: Payer: Self-pay | Admitting: Internal Medicine

## 2018-01-17 ENCOUNTER — Telehealth: Payer: Self-pay

## 2018-01-17 NOTE — Telephone Encounter (Signed)
Did we not already do this? I believe she came in Friday to pick up  Copied from [place] #[id number]. Topic: General - Other >> Jan 14, 2018  8:18 AM Leward Quan A wrote: Reason for CRM: Patient called stated that she left a paper on 01/13/18 to be excused from jury duty and she wanted to come pick it up today. Please call patient and let her know the status of this form. Ph# [phone]

## 2018-01-17 NOTE — Telephone Encounter (Signed)
Fransisco Beau this was filled out on Friday of last week what is going on?  The paper is due 1/7 tomorrow

## 2018-01-18 NOTE — Telephone Encounter (Signed)
Patient picked it up on Friday. We moust have gotten a late note.

## 2018-03-16 ENCOUNTER — Encounter: Payer: Self-pay | Admitting: *Deleted

## 2018-03-31 ENCOUNTER — Encounter: Payer: Self-pay | Admitting: *Deleted

## 2018-05-17 ENCOUNTER — Telehealth: Payer: Self-pay

## 2018-05-17 NOTE — Telephone Encounter (Signed)
Please fax rx to pharmacy

## 2018-05-17 NOTE — Telephone Encounter (Signed)
Copied from [place] [phone] [id number]. Topic: General - Other >> May 17, 2018 10:41 AM Yvette Rack wrote: Reason for CRM: Pt request that a Rx for a tetanus vaccine be ordered and sent to [place], [place]. Pt also asked that the office makes sure that her insurance will cover it.

## 2018-05-17 NOTE — Telephone Encounter (Signed)
rx has been faxed 

## 2018-05-23 ENCOUNTER — Telehealth: Payer: Self-pay | Admitting: *Deleted

## 2018-05-23 DIAGNOSIS — Z122 Encounter for screening for malignant neoplasm of respiratory organs: Secondary | ICD-10-CM

## 2018-05-23 DIAGNOSIS — Z87891 Personal history of nicotine dependence: Secondary | ICD-10-CM

## 2018-05-23 NOTE — Telephone Encounter (Signed)
Patient has been notified that annual lung cancer screening low dose CT scan is due currently or will be in near future. Confirmed that patient is within the age range of 55-77, and asymptomatic, (no signs or symptoms of lung cancer). Patient denies illness that would prevent curative treatment for lung cancer if found. Verified smoking history, (current, 32.7 pack year). The shared decision making visit was done 04/08/17. Patient is agreeable for CT scan being scheduled.

## 2018-05-24 ENCOUNTER — Ambulatory Visit
Admission: RE | Admit: 2018-05-24 | Discharge: 2018-05-24 | Disposition: A | Discharge status: Home / Self Care | Payer: MEDICARE | Source: Ambulatory Visit | Attending: Oncology | Admitting: Oncology

## 2018-05-24 ENCOUNTER — Other Ambulatory Visit: Payer: Self-pay

## 2018-05-24 DIAGNOSIS — Z87891 Personal history of nicotine dependence: Secondary | ICD-10-CM | POA: Diagnosis not present

## 2018-05-24 DIAGNOSIS — F17210 Nicotine dependence, cigarettes, uncomplicated: Secondary | ICD-10-CM | POA: Diagnosis not present

## 2018-05-24 DIAGNOSIS — Z122 Encounter for screening for malignant neoplasm of respiratory organs: Secondary | ICD-10-CM

## 2018-05-26 ENCOUNTER — Encounter: Payer: Self-pay | Admitting: *Deleted

## 2018-06-14 ENCOUNTER — Other Ambulatory Visit: Payer: Self-pay | Admitting: Internal Medicine

## 2018-06-14 DIAGNOSIS — J449 Chronic obstructive pulmonary disease, unspecified: Secondary | ICD-10-CM

## 2018-06-14 MED ORDER — FLUTICASONE-UMECLIDIN-VILANT 100-62.5-25 MCG/INH IN AEPB: 1.0000 | INHALATION_SPRAY | Freq: Every day | RESPIRATORY_TRACT | 4 refills | Status: DC

## 2018-06-22 ENCOUNTER — Other Ambulatory Visit: Payer: Self-pay | Admitting: Internal Medicine

## 2018-06-22 DIAGNOSIS — Z1231 Encounter for screening mammogram for malignant neoplasm of breast: Secondary | ICD-10-CM

## 2018-08-09 ENCOUNTER — Ambulatory Visit
Admission: RE | Admit: 2018-08-09 | Discharge: 2018-08-09 | Disposition: A | Discharge status: Home / Self Care | Payer: MEDICARE | Source: Ambulatory Visit | Attending: Internal Medicine | Admitting: Internal Medicine

## 2018-08-09 ENCOUNTER — Other Ambulatory Visit: Payer: Self-pay

## 2018-08-09 DIAGNOSIS — Z1231 Encounter for screening mammogram for malignant neoplasm of breast: Secondary | ICD-10-CM | POA: Diagnosis not present

## 2018-11-07 ENCOUNTER — Encounter: Payer: Self-pay | Admitting: Family Medicine

## 2018-11-07 ENCOUNTER — Other Ambulatory Visit: Payer: Self-pay

## 2018-11-07 ENCOUNTER — Ambulatory Visit: Payer: MEDICARE | Admitting: Family Medicine

## 2018-11-07 VITALS — Ht 62.5 in | Wt 127.0 lb

## 2018-11-07 DIAGNOSIS — R059 Cough, unspecified: Secondary | ICD-10-CM

## 2018-11-07 DIAGNOSIS — R05 Cough: Secondary | ICD-10-CM

## 2018-11-07 NOTE — Progress Notes (Signed)
Patient ID: Brittany Burch, female   DOB: [birth year], 68 y.o.   MRN: [id number]   Patient declined telephone visit with NP.  Feeling better.

## 2018-11-18 ENCOUNTER — Other Ambulatory Visit: Payer: Self-pay | Admitting: Internal Medicine

## 2018-11-18 ENCOUNTER — Other Ambulatory Visit: Payer: Self-pay

## 2018-11-18 ENCOUNTER — Ambulatory Visit (INDEPENDENT_AMBULATORY_CARE_PROVIDER_SITE_OTHER): Payer: MEDICARE | Admitting: Internal Medicine

## 2018-11-18 ENCOUNTER — Ambulatory Visit: Payer: MEDICARE

## 2018-11-18 VITALS — BP 122/80 | HR 96 | Temp 97.7°F | Ht 62.5 in | Wt 122.4 lb

## 2018-11-18 DIAGNOSIS — L570 Actinic keratosis: Secondary | ICD-10-CM

## 2018-11-18 DIAGNOSIS — Z1231 Encounter for screening mammogram for malignant neoplasm of breast: Secondary | ICD-10-CM | POA: Diagnosis not present

## 2018-11-18 DIAGNOSIS — E559 Vitamin D deficiency, unspecified: Secondary | ICD-10-CM

## 2018-11-18 DIAGNOSIS — Z1329 Encounter for screening for other suspected endocrine disorder: Secondary | ICD-10-CM | POA: Diagnosis not present

## 2018-11-18 DIAGNOSIS — Z1283 Encounter for screening for malignant neoplasm of skin: Secondary | ICD-10-CM | POA: Diagnosis not present

## 2018-11-18 DIAGNOSIS — Z1389 Encounter for screening for other disorder: Secondary | ICD-10-CM

## 2018-11-18 DIAGNOSIS — J449 Chronic obstructive pulmonary disease, unspecified: Secondary | ICD-10-CM

## 2018-11-18 DIAGNOSIS — Z0000 Encounter for general adult medical examination without abnormal findings: Secondary | ICD-10-CM | POA: Diagnosis not present

## 2018-11-18 DIAGNOSIS — D72829 Elevated white blood cell count, unspecified: Secondary | ICD-10-CM

## 2018-11-18 DIAGNOSIS — R7989 Other specified abnormal findings of blood chemistry: Secondary | ICD-10-CM

## 2018-11-18 DIAGNOSIS — E785 Hyperlipidemia, unspecified: Secondary | ICD-10-CM

## 2018-11-18 LAB — LIPID PANEL
Cholesterol: 176 mg/dL (ref 0–200)
HDL: 48.5 mg/dL (ref >39.00)
LDL Cholesterol: 106 mg/dL — ABNORMAL HIGH (ref 0–99)
NonHDL: 127.14
Total CHOL/HDL Ratio: 4
Triglycerides: 104 mg/dL (ref 0.0–149.0)
VLDL: 20.8 mg/dL (ref 0.0–40.0)

## 2018-11-18 LAB — CBC WITH DIFFERENTIAL/PLATELET
Basophils Absolute: 0.1 10*3/uL (ref 0.0–0.1)
Basophils Relative: 0.4 % (ref 0.0–3.0)
Eosinophils Absolute: 0.1 10*3/uL (ref 0.0–0.7)
Eosinophils Relative: 0.9 % (ref 0.0–5.0)
HCT: 41.1 % (ref 36.0–46.0)
Hemoglobin: 13.5 g/dL (ref 12.0–15.0)
Lymphocytes Relative: 20.4 % (ref 12.0–46.0)
Lymphs Abs: 2.8 10*3/uL (ref 0.7–4.0)
MCHC: 32.7 g/dL (ref 30.0–36.0)
MCV: 95.9 fl (ref 78.0–100.0)
Monocytes Absolute: 0.8 10*3/uL (ref 0.1–1.0)
Monocytes Relative: 6 % (ref 3.0–12.0)
Neutro Abs: 9.8 10*3/uL — ABNORMAL HIGH (ref 1.4–7.7)
Neutrophils Relative %: 72.3 % (ref 43.0–77.0)
Platelets: 365 10*3/uL (ref 150.0–400.0)
RBC: 4.29 Mil/uL (ref 3.87–5.11)
RDW: 13.4 % (ref 11.5–15.5)
WBC: 13.6 10*3/uL — ABNORMAL HIGH (ref 4.0–10.5)

## 2018-11-18 LAB — COMPREHENSIVE METABOLIC PANEL
ALT: 16 U/L (ref 0–35)
AST: 17 U/L (ref 0–37)
Albumin: 4.3 g/dL (ref 3.5–5.2)
Alkaline Phosphatase: 97 U/L (ref 39–117)
BUN: 19 mg/dL (ref 6–23)
CO2: 27 mEq/L (ref 19–32)
Calcium: 9.7 mg/dL (ref 8.4–10.5)
Chloride: 101 mEq/L (ref 96–112)
Creatinine, Ser: 0.66 mg/dL (ref 0.40–1.20)
GFR: 88.62 mL/min (ref >60.00)
Glucose, Bld: 85 mg/dL (ref 70–99)
Potassium: 4.6 mEq/L (ref 3.5–5.1)
Sodium: 138 mEq/L (ref 135–145)
Total Bilirubin: 0.3 mg/dL (ref 0.2–1.2)
Total Protein: 7.1 g/dL (ref 6.0–8.3)

## 2018-11-18 LAB — URINALYSIS, ROUTINE W REFLEX MICROSCOPIC
Bacteria, UA: NONE SEEN /HPF
Bilirubin Urine: NEGATIVE
Glucose, UA: NEGATIVE
Hgb urine dipstick: NEGATIVE
Hyaline Cast: NONE SEEN /LPF
Leukocytes,Ua: NEGATIVE
Nitrite: NEGATIVE
Specific Gravity, Urine: 1.039 — ABNORMAL HIGH (ref 1.001–1.03)
pH: <=5 (ref 5.0–8.0)

## 2018-11-18 LAB — TSH: TSH: 0.72 u[IU]/mL (ref 0.35–4.50)

## 2018-11-18 LAB — VITAMIN D 25 HYDROXY (VIT D DEFICIENCY, FRACTURES): VITD: 58.78 ng/mL (ref 30.00–100.00)

## 2018-11-18 MED ORDER — AZITHROMYCIN 250 MG PO TABS: ORAL_TABLET | ORAL | 0 refills | Status: DC

## 2018-11-18 MED ORDER — ATORVASTATIN CALCIUM 40 MG PO TABS: 40.0000 mg | ORAL_TABLET | Freq: Every day | ORAL | 3 refills | Status: DC

## 2018-11-18 MED ORDER — ALBUTEROL SULFATE HFA 108 (90 BASE) MCG/ACT IN AERS: 1.0000 | INHALATION_SPRAY | RESPIRATORY_TRACT | 11 refills | Status: DC | PRN

## 2018-11-18 NOTE — Progress Notes (Signed)
Chief Complaint  Patient presents with  . Follow-up   F/u  1. hld on lipitor 40 mg qhs  2. COPD mild to moderate CT scan 05/2018 trelegy helping still smoking 1-2 pk per week  Had cough 10/2018 and wanted to know what to take otc disc mucinex dm green label or robitussin sm  No sob, fever, wheezing    Review of Systems  Constitutional: Negative for weight loss.  HENT: Negative for hearing loss.   Eyes: Negative for blurred vision.  Respiratory: Negative for shortness of breath.   Cardiovascular: Negative for chest pain.  Gastrointestinal: Negative for abdominal pain.  Musculoskeletal: Negative for falls.  Skin: Negative for rash.  Neurological: Negative for headaches.  Psychiatric/Behavioral: Negative for depression.   Past Medical History:  Diagnosis Date  . Allergy    cats   . Cancer ([place])    cervical  . Hyperlipidemia    Past Surgical History:  Procedure Laterality Date  . ABDOMINAL HYSTERECTOMY     1983  . COLONOSCOPY WITH PROPOFOL N/A 12/31/2017   Procedure: COLONOSCOPY WITH PROPOFOL;  Surgeon: Virgel Manifold, MD;  Location: [facility] ENDOSCOPY;  Service: Endoscopy;  Laterality: N/A;   Family History  Problem Relation Age of Onset  . Cancer Mother        lung cancer smoker died 62  . Muscular dystrophy Father   . Muscular dystrophy Sister   . Muscular dystrophy Sister   . Breast cancer Neg Hx    Social History   Socioeconomic History  . Marital status: Widowed    Spouse name: Not on file  . Number of children: Not on file  . Years of education: Not on file  . Highest education level: Not on file  Occupational History  . Not on file  Social Needs  . Financial resource strain: Not hard at all  . Food insecurity    Worry: Never true    Inability: Never true  . Transportation needs    Medical: No    Non-medical: No  Tobacco Use  . Smoking status: Current Every Day Smoker    Packs/day: 0.70    Years: 46.00    Pack years: 32.20  . Smokeless  tobacco: Never Used  Substance and Sexual Activity  . Alcohol use: No    Frequency: Never  . Drug use: No  . Sexual activity: Never  Lifestyle  . Physical activity    Days per week: 4 days    Minutes per session: 20 min  . Stress: Not at all  Relationships  . Social Herbalist on phone: Not on file    Gets together: Not on file    Attends religious service: Not on file    Active member of club or organization: Not on file    Attends meetings of clubs or organizations: Not on file    Relationship status: Not on file  . Intimate partner violence    Fear of current or ex partner: Not on file    Emotionally abused: Not on file    Physically abused: Not on file    Forced sexual activity: Not on file  Other Topics Concern  . Not on file  Social History Narrative   Widowed    12 grade education    No outpatient medications have been marked as taking for the 11/18/18 encounter (Office Visit) with McLean-Scocuzza, Nino Glow, MD.   No Known Allergies No results found for this or any previous visit (from  the past 2160 hour(s)). Objective  Body mass index is 22.03 kg/m. Wt Readings from Last 3 Encounters:  11/18/18 122 lb 6.4 oz (55.5 kg)  11/07/18 127 lb (57.6 kg)  05/24/18 126 lb (57.2 kg)   Temp Readings from Last 3 Encounters:  11/18/18 97.7 F (36.5 C) (Oral)  12/31/17 (!) 96.9 F (36.1 C) (Tympanic)  11/17/17 98.1 F (36.7 C) (Oral)   BP Readings from Last 3 Encounters:  11/18/18 122/80  12/31/17 93/76  11/17/17 124/64   Pulse Readings from Last 3 Encounters:  11/18/18 96  12/31/17 75  11/17/17 63    Physical Exam Vitals signs and nursing note reviewed.  Constitutional:      Appearance: Normal appearance. She is well-developed and well-groomed.     Comments: +mask on    HENT:     Head: Normocephalic and atraumatic.  Eyes:     Pupils: Pupils are equal, round, and reactive to light.  Cardiovascular:     Rate and Rhythm: Normal rate and regular  rhythm.     Heart sounds: Normal heart sounds. No murmur.  Pulmonary:     Effort: Pulmonary effort is normal.     Breath sounds: Normal breath sounds.  Abdominal:     General: Abdomen is flat. Bowel sounds are normal.     Tenderness: There is no abdominal tenderness.  Skin:    General: Skin is warm and dry.     Comments: ak to nose    Neurological:     General: No focal deficit present.     Mental Status: She is alert and oriented to person, place, and time. Mental status is at baseline.     Gait: Gait normal.  Psychiatric:        Attention and Perception: Attention and perception normal.        Mood and Affect: Mood and affect normal.        Speech: Speech normal.        Behavior: Behavior normal. Behavior is cooperative.        Thought Content: Thought content normal.        Cognition and Memory: Cognition and memory normal.        Judgment: Judgment normal.     Assessment  Plan   COPD controlled reviewed red flags  CT chest due 05/5019  rec cessation  At times cough disc mucinex dm otc or robitussin dm otc   Hyperlipidemia, unspecified hyperlipidemia type - Plan: Lipid panel, atorvastatin (LIPITOR) 40 MG tablet  Actinic keratosis nose- Plan: Ambulatory referral to Dermatology  Skin cancer screening - Plan: Ambulatory referral to Dermatology   HM Had flu shot utd  Had pna 12 09/04/17  Tdap 05/17/18 prevnar may have had 06/22/16  zostervax 01/23/09 disc shingrix had 2/2   Declines MMR check Consider hep B vaccine Hep C neg   mammo neg 08/09/18 neg Pap s/p hysterectomy had 1 nl years ago with ho abnormal; pap s/p hysterectomy nl per pt  Colonoscopy  12/31/17 normal Dr. Darene Lamer DEXA 04/2012 normal ? If others with Dr. Lavera Guise. DEXA 10/12/17 normal  +smoker rec cessation smoking 20-30 years h/o lung cancer in mother max 4 pks/week now 1-2 pks/week.  -CT scan due 05/24/18 h/o lung nodule and copd noted in smoker repeat in 1 year rec cessation    Provider: Dr. Olivia Mackie  [facility]

## 2018-11-18 NOTE — Patient Instructions (Addendum)
[place]  DM green label or Robitussin DM Guafenisin containing  oxi clean   [place] [phone] Actinic Keratosis An actinic keratosis is a precancerous growth on the skin. If there is more than one growth, the condition is called actinic keratoses. Actinic keratoses appear most often on areas of skin that get a lot of sun exposure, including the scalp, face, ears, lips, upper back, forearms, and the backs of the hands. If left untreated, these growths may develop into a skin cancer called squamous cell carcinoma. It is important to have all these growths checked by a health care provider to determine the best treatment approach. What are the causes? Actinic keratoses are caused by getting too much ultraviolet (UV) radiation from the sun or other UV light sources. What increases the risk? You are more likely to develop this condition if you:  Have light-colored skin and blue eyes.  Have blond or red hair.  Spend a lot of time in the sun.  Do not protect your skin from the sun when outdoors.  Are an older person. The risk of developing an actinic keratosis increases with age. What are the signs or symptoms? Actinic keratoses feel like scaly, rough spots of skin. Symptoms of this condition include growths that may:  Be as small as a pinhead or as big as a quarter.  Itch, hurt, or feel sensitive.  Be skin-colored, light tan, dark tan, pink, or a combination of any of these colors. In most cases, the growths become red.  Have a small piece of pink or gray skin (skin tag) growing from them. It may be easier to notice actinic keratoses by feeling them, rather than seeing them. Sometimes, actinic keratoses disappear, but many reappear a few days to a few weeks later. How is this diagnosed? This condition is usually diagnosed with a physical exam.  A tissue sample may be removed from the actinic keratosis and examined under a microscope (biopsy). How is this treated? If  needed, this condition may be treated by:  Scraping off the actinic keratosis (curettage).  Freezing the actinic keratosis with liquid nitrogen (cryosurgery). This causes the growth to eventually fall off the skin.  Applying medicated creams or gels to destroy the cells in the growth.  Applying chemicals to the actinic keratosis to make the outer layers of skin peel off (chemical peel).  Using photodynamic therapy. In this procedure, medicated cream is applied to the actinic keratosis. This cream increases your skin's sensitivity to light. Then, a strong light is aimed at the actinic keratosis to destroy cells in the growth. Follow these instructions at home: Skin care  Apply cool, wet cloths (cool compresses) to the affected areas.  Do not scratch your skin.  Check your skin regularly for any growths, especially growths that: ? Start to itch or bleed. ? Change in size, shape, or color. Caring for the treated area  Keep the treated area clean and dry as told by your health care provider.  Do not apply any medicine, cream, or lotion to the treated area unless your health care provider tells you to do that.  Do not pick at blisters or try to break them open. This can cause infection and scarring.  If you have red or irritated skin after treatment, follow instructions from your health care provider about how to take care of the treated area. Make sure you: ? Wash your hands with soap and water before you change your bandage (dressing). If soap  and water are not available, use hand sanitizer. ? Change your dressing as told by your health care provider.  If you have red or irritated skin after treatment, check your treated area every day for signs of infection. Check for: ? Redness, swelling, or pain. ? Fluid or blood. ? Warmth. ? Pus or a bad smell. General instructions  Take or apply over-the-counter and prescription medicines only as told by your health care provider.  Return  to your normal activities as told by your health care provider. Ask your health care provider what activities are safe for you.  Have a skin exam done every year by a health care provider who is a skin specialist (dermatologist).  Keep all follow-up visits as told by your health care provider. This is important. Lifestyle  Do not use any products that contain nicotine or tobacco, such as cigarettes and e-cigarettes. If you need help quitting, ask your health care provider.  Take steps to protect your skin from the sun. ? Try to avoid the sun between 10:00 a.m. and 4:00 p.m. This is when the UV light is the strongest. ? Use a sunscreen or sunblock with SPF 30 (sun protection factor 30) or greater. ? Apply sunscreen before you are exposed to sunlight and reapply as often as directed by the instructions on the sunscreen container. ? Always wear sunglasses that have UV protection, and always wear a hat and clothing to protect your skin from sunlight. ? When possible, avoid medicines that increase your sensitivity to sunlight. ? Do not use tanning beds or other indoor tanning devices. Contact a health care provider if:  You notice any changes or new growths on your skin.  You have swelling, pain, or more redness around your treated area.  You have fluid or blood coming from your treated area.  Your treated area feels warm to the touch.  You have pus or a bad smell coming from your treated area.  You have a fever.  You have a blister that becomes large and painful. Summary  An actinic keratosis is a precancerous growth on the skin. If there is more than one growth, the condition is called actinic keratoses. In some cases, if left untreated, these growths can develop into skin cancer.  Check your skin regularly for any growths, especially growths that start to itch or bleed, or change in size, shape, or color.  Take steps to protect your skin from the sun.  Contact a health care  provider if you notice any changes or new growths on your skin.  Keep all follow-up visits as told by your health care provider. This is important. This information is not intended to replace advice given to you by your health care provider. Make sure you discuss any questions you have with your health care provider. Document Released: 03/27/2008 Document Revised: 05/11/2017 Document Reviewed: 05/11/2017 Elsevier Patient Education  2020 [employer].   Chronic Obstructive Pulmonary Disease  Chronic obstructive pulmonary disease (COPD) is a long-term (chronic) condition that affects the lungs. COPD is a general term that can be used to describe many different lung problems that cause lung swelling (inflammation) and limit airflow, including chronic bronchitis and emphysema. If you have COPD, your lung function will probably never return to normal. In most cases, it gets worse over time. However, there are steps you can take to slow the progression of the disease and improve your quality of life. What are the causes? This condition may be caused by:  Smoking. This is the most common cause.  Certain genes passed down through families. What increases the risk? The following factors may make you more likely to develop this condition:  Secondhand smoke from cigarettes, pipes, or cigars.  Exposure to chemicals and other irritants such as fumes and dust in the work environment.  Chronic lung conditions or infections. What are the signs or symptoms? Symptoms of this condition include:  Shortness of breath, especially during physical activity.  Chronic cough with a large amount of thick mucus. Sometimes the cough may not have any mucus (dry cough).  Wheezing.  Rapid breaths.  Gray or bluish discoloration (cyanosis) of the skin, especially in your fingers, toes, or lips.  Feeling tired (fatigue).  Weight loss.  Chest tightness.  Frequent infections.  Episodes when breathing  symptoms become much worse (exacerbations).  Swelling in the ankles, feet, or legs. This may occur in later stages of the disease. How is this diagnosed? This condition is diagnosed based on:  Your medical history.  A physical exam. You may also have tests, including:  Lung (pulmonary) function tests. This may include a spirometry test, which measures your ability to exhale properly.  Chest X-ray.  CT scan.  Blood tests. How is this treated? This condition may be treated with:  Medicines. These may include inhaled rescue medicines to treat acute exacerbations as well as long-term, or maintenance, medicines to prevent flare-ups of COPD. ? Bronchodilators help treat COPD by dilating the airways to allow increased airflow and make your breathing more comfortable. ? Steroids can reduce airway inflammation and help prevent exacerbations.  Smoking cessation. If you smoke, your health care provider may ask you to quit, and may also recommend therapy or replacement products to help you quit.  Pulmonary rehabilitation. This may involve working with a team of health care providers and specialists, such as respiratory, occupational, and physical therapists.  Exercise and physical activity. These are beneficial for nearly all people with COPD.  Nutrition therapy to gain weight, if you are underweight.  Oxygen. Supplemental oxygen therapy is only helpful if you have a low oxygen level in your blood (hypoxemia).  Lung surgery or transplant.  Palliative care. This is to help people with COPD feel comfortable when treatment is no longer working. Follow these instructions at home: Medicines  Take over-the-counter and prescription medicines (inhaled or pills) only as told by your health care provider.  Talk to your health care provider before taking any cough or allergy medicines. You may need to avoid certain medicines that dry out your airways. Lifestyle  If you are a smoker, the most  important thing that you can do is to stop smoking. Do not use any products that contain nicotine or tobacco, such as cigarettes and e-cigarettes. If you need help quitting, ask your health care provider. Continuing to smoke will cause the disease to progress faster.  Avoid exposure to things that irritate your lungs, such as smoke, chemicals, and fumes.  Stay active, but balance activity with periods of rest. Exercise and physical activity will help you maintain your ability to do things you want to do.  Learn and use relaxation techniques to manage stress and to control your breathing.  Get the right amount of sleep and get quality sleep. Most adults need 7 or more hours per night.  Eat healthy foods. Eating smaller, more frequent meals and resting before meals may help you maintain your strength. Controlled breathing Learn and use controlled breathing techniques as directed by  your health care provider. Controlled breathing techniques include:  Pursed lip breathing. Start by breathing in (inhaling) through your nose for 1 second. Then, purse your lips as if you were going to whistle and breathe out (exhale) through the pursed lips for 2 seconds.  Diaphragmatic breathing. Start by putting one hand on your abdomen just above your waist. Inhale slowly through your nose. The hand on your abdomen should move out. Then purse your lips and exhale slowly. You should be able to feel the hand on your abdomen moving in as you exhale. Controlled coughing Learn and use controlled coughing to clear mucus from your lungs. Controlled coughing is a series of short, progressive coughs. The steps of controlled coughing are: 1. Lean your head slightly forward. 2. Breathe in deeply using diaphragmatic breathing. 3. Try to hold your breath for 3 seconds. 4. Keep your mouth slightly open while coughing twice. 5. Spit any mucus out into a tissue. 6. Rest and repeat the steps once or twice as needed. General  instructions  Make sure you receive all the vaccines that your health care provider recommends, especially the pneumococcal and influenza vaccines. Preventing infection and hospitalization is very important when you have COPD.  Use oxygen therapy and pulmonary rehabilitation if directed to by your health care provider. If you require home oxygen therapy, ask your health care provider whether you should purchase a pulse oximeter to measure your oxygen level at home.  Work with your health care provider to develop a COPD action plan. This will help you know what steps to take if your condition gets worse.  Keep other chronic health conditions under control as told by your health care provider.  Avoid extreme temperature and humidity changes.  Avoid contact with people who have an illness that spreads from person to person (is contagious), such as viral infections or pneumonia.  Keep all follow-up visits as told by your health care provider. This is important. Contact a health care provider if:  You are coughing up more mucus than usual.  There is a change in the color or thickness of your mucus.  Your breathing is more labored than usual.  Your breathing is faster than usual.  You have difficulty sleeping.  You need to use your rescue medicines or inhalers more often than expected.  You have trouble doing routine activities such as getting dressed or walking around the house. Get help right away if:  You have shortness of breath while you are resting.  You have shortness of breath that prevents you from: ? Being able to talk. ? Performing your usual physical activities.  You have chest pain lasting longer than 5 minutes.  Your skin color is more blue (cyanotic) than usual.  You measure low oxygen saturations for longer than 5 minutes with a pulse oximeter.  You have a fever.  You feel too tired to breathe normally. Summary  Chronic obstructive pulmonary disease (COPD)  is a long-term (chronic) condition that affects the lungs.  Your lung function will probably never return to normal. In most cases, it gets worse over time. However, there are steps you can take to slow the progression of the disease and improve your quality of life.  Treatment for COPD may include taking medicines, quitting smoking, pulmonary rehabilitation, and changes to diet and exercise. As the disease progresses, you may need oxygen therapy, a lung transplant, or palliative care.  To help manage your condition, do not smoke, avoid exposure to things that irritate  your lungs, stay up to date on all vaccines, and follow your health care provider's instructions for taking medicines. This information is not intended to replace advice given to you by your health care provider. Make sure you discuss any questions you have with your health care provider. Document Released: 10/08/2004 Document Revised: 12/11/2016 Document Reviewed: 02/03/2016 Elsevier Patient Education  2020 [employer].

## 2018-11-21 ENCOUNTER — Telehealth: Payer: Self-pay | Admitting: Internal Medicine

## 2018-11-21 NOTE — Telephone Encounter (Signed)
I called pt and left a vm to call ofc to schedule Return in about 1 year (around 11/18/2019) for sooner prn .

## 2018-11-22 ENCOUNTER — Telehealth: Payer: Self-pay | Admitting: *Deleted

## 2018-11-22 NOTE — Telephone Encounter (Signed)
Copied from [place] [id number]. Topic: General - Inquiry >> Nov 22, 2018 11:53 AM Virl Axe D wrote: Reason for CRM: Pt stated she received a call from [place] the other day with her lab results however she was on the road and did not fully get what he was saying . Requesting CB from Fransisco Beau to go over labs again so she understands. Please advise.

## 2018-11-23 ENCOUNTER — Encounter: Payer: Self-pay | Admitting: Internal Medicine

## 2018-11-23 NOTE — Telephone Encounter (Signed)
Spoken to patient and answered all questions.

## 2018-11-24 ENCOUNTER — Ambulatory Visit (INDEPENDENT_AMBULATORY_CARE_PROVIDER_SITE_OTHER): Payer: MEDICARE

## 2018-11-24 ENCOUNTER — Other Ambulatory Visit: Payer: Self-pay

## 2018-11-24 DIAGNOSIS — Z0000 Encounter for general adult medical examination without abnormal findings: Secondary | ICD-10-CM

## 2018-11-24 NOTE — Progress Notes (Signed)
Subjective:   Brittany Burch is a 69 y.o. female who presents for Medicare Annual (Subsequent) preventive examination.  Review of Systems:  No ROS.  Medicare Wellness Virtual Visit.  Visual/audio telehealth visit, UTA vital signs.   See social history for additional risk factors.   Cardiac Risk Factors include: advanced age (>21men, >40 women)     Objective:     Vitals: There were no vitals taken for this visit.  There is no height or weight on file to calculate BMI.  Advanced Directives 11/24/2018 12/31/2017 11/17/2017  Does Patient Have a Medical Advance Directive? Yes Yes No  Type of Advance Directive Living will Living will -  Would patient like information on creating a medical advance directive? - - Yes (MAU/Ambulatory/Procedural Areas - Information given)    Tobacco Social History   Tobacco Use  Smoking Status Current Every Day Smoker  . Packs/day: 0.70  . Years: 46.00  . Pack years: 32.20  Smokeless Tobacco Never Used     Ready to quit: Not Answered Counseling given: Not Answered   Clinical Intake:  Pre-visit preparation completed: Yes        Diabetes: No  How often do you need to have someone help you when you read instructions, pamphlets, or other written materials from your doctor or pharmacy?: 1 - Never  Interpreter Needed?: No     Past Medical History:  Diagnosis Date  . Allergy    cats   . Cancer ([place])    cervical  . Hyperlipidemia    Past Surgical History:  Procedure Laterality Date  . ABDOMINAL HYSTERECTOMY     1983  . COLONOSCOPY WITH PROPOFOL N/A 12/31/2017   Procedure: COLONOSCOPY WITH PROPOFOL;  Surgeon: Virgel Manifold, MD;  Location: [facility] ENDOSCOPY;  Service: Endoscopy;  Laterality: N/A;   Family History  Problem Relation Age of Onset  . Cancer Mother        lung cancer smoker died 70  . Muscular dystrophy Father   . Muscular dystrophy Sister   . Muscular dystrophy Sister   . Breast cancer Neg Hx    Social  History   Socioeconomic History  . Marital status: Widowed    Spouse name: Not on file  . Number of children: Not on file  . Years of education: Not on file  . Highest education level: Not on file  Occupational History  . Not on file  Social Needs  . Financial resource strain: Not hard at all  . Food insecurity    Worry: Never true    Inability: Never true  . Transportation needs    Medical: No    Non-medical: No  Tobacco Use  . Smoking status: Current Every Day Smoker    Packs/day: 0.70    Years: 46.00    Pack years: 32.20  . Smokeless tobacco: Never Used  Substance and Sexual Activity  . Alcohol use: No    Frequency: Never  . Drug use: No  . Sexual activity: Never  Lifestyle  . Physical activity    Days per week: 4 days    Minutes per session: 20 min  . Stress: Not at all  Relationships  . Social Herbalist on phone: Not on file    Gets together: Not on file    Attends religious service: Not on file    Active member of club or organization: Not on file    Attends meetings of clubs or organizations: Not on file  Relationship status: Not on file  Other Topics Concern  . Not on file  Social History Narrative   Widowed    12 grade education     Outpatient Encounter Medications as of 11/24/2018  Medication Sig  . albuterol (VENTOLIN HFA) 108 (90 Base) MCG/ACT inhaler Inhale 1-2 puffs into the lungs every 4 (four) hours as needed for wheezing or shortness of breath.  Marland Kitchen aspirin EC 81 MG tablet Take 81 mg by mouth daily.  Marland Kitchen atorvastatin (LIPITOR) 40 MG tablet Take 1 tablet (40 mg total) by mouth daily at 6 PM.  . azithromycin (ZITHROMAX) 250 MG tablet 2 pills day and 1 pill day 2-5 with food  . Fluticasone-Umeclidin-Vilant (TRELEGY ELLIPTA) 100-62.5-25 MCG/INH AEPB Inhale 1 puff into the lungs daily. Rinse mouth   No facility-administered encounter medications on file as of 11/24/2018.     Activities of Daily Living In your present state of health,  do you have any difficulty performing the following activities: 11/24/2018  Hearing? N  Vision? N  Difficulty concentrating or making decisions? N  Walking or climbing stairs? N  Dressing or bathing? N  Doing errands, shopping? N  Preparing Food and eating ? N  Using the Toilet? N  In the past six months, have you accidently leaked urine? N  Do you have problems with loss of bowel control? N  Managing your Medications? N  Managing your Finances? N  Housekeeping or managing your Housekeeping? N  Some recent data might be hidden    Patient Care Team: McLean-Scocuzza, Nino Glow, MD as PCP - General (Internal Medicine)    Assessment:   This is a routine wellness examination for Brittany Burch.  Nurse connected with patient 11/24/18 at  9:30 AM EST by a telephone enabled telemedicine application and verified that I am speaking with the correct person using two identifiers. Patient stated full name and DOB. Patient gave permission to continue with virtual visit. Patient's location was at home and Nurse's location was at [place] office.   Health Maintenance Due: -Mammogram- ordered 11/18/18; she plans to schedule Update all pending maintenance due as appropriate.   See completed HM at the end of note.   Eye: Visual acuity not assessed. Virtual visit. Wears corrective lenses. Followed by their ophthalmologist every 12 months.   Dental: Dentures- yes  Hearing: Demonstrates normal hearing during visit.  Safety:  Patient feels safe at home- yes Patient does have smoke detectors at home- yes Patient does wear sunscreen or protective clothing when in direct sunlight - yes Patient does wear seat belt when in a moving vehicle - yes Patient drives- yes Adequate lighting in walkways free from debris- yes Grab bars and handrails used as appropriate- yes Ambulates with no assistive device Cell phone on person when ambulating outside of the home- yes  Social: Alcohol intake - no   Smoking  history- current Illicit drug use? none  Depression: PHQ 2 &9 complete. See screening below. Denies irritability, anhedonia, sadness/tearfullness.    Falls: See screening below.    Medication: Taking as directed and without issues.   Covid-19: Precautions and sickness symptoms discussed. Wears mask, social distancing, hand hygiene as appropriate.   Activities of Daily Living Patient denies needing assistance with: household chores, feeding themselves, getting from bed to chair, getting to the toilet, bathing/showering, dressing, managing money, or preparing meals.   Memory: Patient is alert. Patient denies difficulty focusing or concentrating. Correctly identified the president of the [place], season and recall.  BMI- discussed the  importance of a healthy diet, water intake and the benefits of aerobic exercise.  Educational material provided.  Physical activity- walking, no routine.   Diet:  Regular  Other Providers Patient Care Team: McLean-Scocuzza, Nino Glow, MD as PCP - General (Internal Medicine)  Exercise Activities and Dietary recommendations Current Exercise Habits: Home exercise routine, Type of exercise: walking  Goals    . Increase physical activity     Walk more consistently for exercise       Fall Risk Fall Risk  11/24/2018 11/18/2018 11/17/2017 02/16/2017  Falls in the past year? 0 0 0 No   Timed Get Up and Go performed: no, virtual visit  Depression Screen PHQ 2/9 Scores 11/24/2018 11/18/2018 11/17/2017 02/16/2017  PHQ - 2 Score 0 0 0 0     Cognitive Function MMSE - Mini Mental State Exam 11/17/2017  Orientation to time 5  Orientation to Place 5  Registration 3  Attention/ Calculation 5  Recall 3  Language- name 2 objects 2  Language- repeat 1  Language- follow 3 step command 3  Language- read & follow direction 1  Write a sentence 1  Copy design 1  Total score 30     6CIT Screen 11/24/2018  What Year? 0 points  What month? 0 points  What time?  0 points  Count back from 20 0 points  Months in reverse 0 points  Repeat phrase 0 points  Total Score 0    Immunization History  Administered Date(s) Administered  . Influenza, High Dose Seasonal PF 09/24/2017  . Influenza-Unspecified 10/17/2018  . Pneumococcal Conjugate-13 06/22/2016  . Pneumococcal Polysaccharide-23 09/04/2017  . Tdap 05/17/2018  . Zoster Recombinat (Shingrix) 11/23/2017, 01/31/2018   Screening Tests Health Maintenance  Topic Date Due  . MAMMOGRAM  08/08/2020  . COLONOSCOPY  01/01/2028  . TETANUS/TDAP  05/16/2028  . INFLUENZA VACCINE  Completed  . DEXA SCAN  Completed  . Hepatitis C Screening  Completed  . PNA vac Low Risk Adult  Completed      Plan:   Keep all routine maintenance appointments.   Follow up 11/21/19 @ 10:30  Medicare Attestation I have personally reviewed: The patient's medical and social history Their use of alcohol, tobacco or illicit drugs Their current medications and supplements The patient's functional ability including ADLs,fall risks, home safety risks, cognitive, and hearing and visual impairment Diet and physical activities Evidence for depression   In addition, I have reviewed and discussed with patient certain preventive protocols, quality metrics, and best practice recommendations. A written personalized care plan for preventive services as well as general preventive health recommendations were provided to patient via mail.     Varney Biles, LPN  [phone]

## 2018-11-24 NOTE — Patient Instructions (Addendum)
  Brittany Burch , Thank you for taking time to come for your Medicare Wellness Visit. I appreciate your ongoing commitment to your health goals. Please review the following plan we discussed and let me know if I can assist you in the future.   These are the goals we discussed: Goals    . Increase physical activity     Walk more consistently for exercise       This is a list of the screening recommended for you and due dates:  Health Maintenance  Topic Date Due  . Mammogram  08/08/2020  . Colon Cancer Screening  01/01/2028  . Tetanus Vaccine  05/16/2028  . Flu Shot  Completed  . DEXA scan (bone density measurement)  Completed  .  Hepatitis C: One time screening is recommended by Center for Disease Control  (CDC) for  adults born from [birth year] through 1966.   Completed  . Pneumonia vaccines  Completed

## 2018-11-25 ENCOUNTER — Other Ambulatory Visit: Payer: Self-pay | Admitting: Internal Medicine

## 2018-11-25 DIAGNOSIS — J449 Chronic obstructive pulmonary disease, unspecified: Secondary | ICD-10-CM

## 2018-11-25 MED ORDER — ALBUTEROL SULFATE HFA 108 (90 BASE) MCG/ACT IN AERS: 1.0000 | INHALATION_SPRAY | RESPIRATORY_TRACT | 3 refills | Status: DC | PRN

## 2018-12-06 ENCOUNTER — Other Ambulatory Visit: Payer: Self-pay

## 2018-12-12 ENCOUNTER — Other Ambulatory Visit: Payer: Self-pay

## 2018-12-12 ENCOUNTER — Other Ambulatory Visit (INDEPENDENT_AMBULATORY_CARE_PROVIDER_SITE_OTHER): Payer: MEDICARE

## 2018-12-12 DIAGNOSIS — D72829 Elevated white blood cell count, unspecified: Secondary | ICD-10-CM

## 2018-12-12 DIAGNOSIS — R7989 Other specified abnormal findings of blood chemistry: Secondary | ICD-10-CM

## 2018-12-12 LAB — CBC WITH DIFFERENTIAL/PLATELET
Basophils Absolute: 0 10*3/uL (ref 0.0–0.1)
Basophils Relative: 0.4 % (ref 0.0–3.0)
Eosinophils Absolute: 0.1 10*3/uL (ref 0.0–0.7)
Eosinophils Relative: 1.2 % (ref 0.0–5.0)
HCT: 41 % (ref 36.0–46.0)
Hemoglobin: 13.7 g/dL (ref 12.0–15.0)
Lymphocytes Relative: 29.3 % (ref 12.0–46.0)
Lymphs Abs: 2.6 10*3/uL (ref 0.7–4.0)
MCHC: 33.5 g/dL (ref 30.0–36.0)
MCV: 95.5 fl (ref 78.0–100.0)
Monocytes Absolute: 0.6 10*3/uL (ref 0.1–1.0)
Monocytes Relative: 6.2 % (ref 3.0–12.0)
Neutro Abs: 5.7 10*3/uL (ref 1.4–7.7)
Neutrophils Relative %: 62.9 % (ref 43.0–77.0)
Platelets: 293 10*3/uL (ref 150.0–400.0)
RBC: 4.29 Mil/uL (ref 3.87–5.11)
RDW: 13.8 % (ref 11.5–15.5)
WBC: 9 10*3/uL (ref 4.0–10.5)

## 2018-12-16 ENCOUNTER — Telehealth: Payer: Self-pay | Admitting: *Deleted

## 2018-12-16 NOTE — Telephone Encounter (Signed)
No no dementia advise pt/daughter    [place]

## 2018-12-16 NOTE — Telephone Encounter (Signed)
Patient was informed.  Patient understood and no questions, comments, or concerns at this time.  
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
## 2019-01-26 ENCOUNTER — Telehealth: Payer: Self-pay | Admitting: Internal Medicine

## 2019-01-26 NOTE — Telephone Encounter (Signed)
Pt called wanting to know if its ok if she gets the covid vaccine? Please advise and thank you!  Call pt @ [phone].

## 2019-01-26 NOTE — Telephone Encounter (Signed)
Mail this to patient  They are doing vaccines for 75 and up now  If she has a history of severe allergic reaction where throat closes the covid vaccine is not recommended for her  The vaccine is new so hard to know how her body will react or anyone  If she wants it check with [employer] news online or the career and technical center [facility] in [place] currently doing covid 19 vaccines drive through for 75 and up 7 am M-F  Next group is 75 and up    COVID-19 Vaccine Information can be found at: [email or web address].co.uk For questions related to vaccine distribution or appointments, please email vaccine@[email or web address] or call [phone].     We are committed to keeping you informed about the COVID-19 vaccine.  As the vaccine continues to become available for each phase, we will ensure that patients who meet the criteria receive the information they need to access vaccination opportunities. Continue to check your MyChart account and [email or web address] for updates. Please review the Phase 1b information below.  Following [place]'s guidelines for the distribution of COVID-19 vaccines we are pleased to share our plans to begin offering vaccines to those 75 and older (Phase 1b). Here are details of those plans:  [employer] On Tuesday, Jan. 19, the [facility]) and [place] begin large-scale COVID-19 vaccinations at the [place]. The vaccinations are appointment only and for those 70 and older.  It is expected that 750 will initially be vaccinated per day at the [facility]. Capacity is expected to grow in the weeks ahead. However, the number of reservations accepted depends on the amount of vaccine available.  Online or Phone Registration Only Walk-ins will NOT be accepted. Registration will open on Friday,  January 15.  Health Department Registration [facility] residents only) [place] (Any local residents)  Other Counties We are also working in partnership with county health agencies in [place], [place] and [place] counties to ensure continuing vaccination availability in alignment with state guidelines in the weeks and months ahead. Information on phase 1b COVID-19 vaccination clinics being offered by local county health agencies is provided in the website links below for your convenience: [place]'s phase 1b vaccination guidelines, prioritizing those 75 and over as the next eligible group to receive the COVID-19 vaccine, are detailed at MobCommunity.ch.   Vaccine Safety and Effectiveness Clinical trials for the Pfizer COVID-19 vaccine involved 42,000 people and showed that the vaccine is more than 95% effective in preventing COVID-19 with no serious safety concerns. Similar results have been reported for the Moderna COVID-19 vaccine. Side effects reported in the [facility] clinical trials include a sore arm at the injection site, fatigue, headache, chills and fever. While side effects from the [place] COVID-19 vaccine are higher than for a typical flu vaccine, they are lower in many ways than side effects from the leading vaccine to prevent shingles. Side effects are signs that a vaccine is working and are related to your immune system being stimulated to produce antibodies against infection. Side effects from vaccination are far less significant than health impacts from COVID-19.  Staying Informed Pharmacists, infectious disease doctors, critical care nurses and other experts at [facility] continue to speak publicly through media interviews and direct communication with our patients and communities about the safety, effectiveness and importance of vaccines to eliminate COVID-19. In addition, reliable information on  vaccine safety, effectiveness, side effects and more is available on the following websites: [place] Department of Health and Human Services COVID-19 Vaccine Information Website. U.S. Centers for Disease Control and Prevention XX123456 Human resources officer.  Staying Safe We agree with the CDC on what we can do to help our communities get back to normal: Getting "back to normal" is going to take all of our tools. If we use all the tools we have, we stand the best chance of getting our families, communities, schools and workplaces "back to normal" sooner: Get vaccinated as soon as vaccines become available within the phase of the state's vaccination rollout plan for which you meet the eligibility criteria. Wear a mask. Stay 6 feet from others and avoid crowds. Wash hands often.  For our most current information, please visit [email or web address].

## 2019-05-01 ENCOUNTER — Ambulatory Visit: Payer: MEDICARE | Admitting: Dermatology

## 2019-05-05 ENCOUNTER — Telehealth: Payer: Self-pay

## 2019-05-05 DIAGNOSIS — Z87891 Personal history of nicotine dependence: Secondary | ICD-10-CM

## 2019-05-05 NOTE — Telephone Encounter (Signed)
Message left notifying patient that it is time to schedule the low dose lung cancer screening CT scan.  Instructed patient to return call to Shawn Perkins at [phone] to verify information prior to CT scan being scheduled.    

## 2019-05-08 NOTE — Telephone Encounter (Signed)
Patient has been notified that annual lung cancer screening low dose CT scan is due currently or will be in near future. Confirmed that patient is within the age range of 55-77, and asymptomatic, (no signs or symptoms of lung cancer). Patient denies illness that would prevent curative treatment for lung cancer if found. Verified smoking history, (current, 33 pack year). The shared decision making visit was done 04/08/17. Patient is agreeable for CT scan being scheduled.

## 2019-05-08 NOTE — Addendum Note (Signed)
Addended by: Lieutenant Diego on: 05/08/2019 11:12 AM   Modules accepted: Orders

## 2019-05-19 NOTE — Telephone Encounter (Signed)
Patient informed of low dose lung cancer screening CT scan appointment on 05/25/19 @ 9:00.

## 2019-05-25 ENCOUNTER — Ambulatory Visit: Admission: RE | Admit: 2019-05-25 | Payer: MEDICARE | Source: Ambulatory Visit

## 2019-05-30 ENCOUNTER — Ambulatory Visit
Admission: RE | Admit: 2019-05-30 | Discharge: 2019-05-30 | Disposition: A | Discharge status: Home / Self Care | Payer: MEDICARE | Source: Ambulatory Visit | Attending: Oncology | Admitting: Oncology

## 2019-05-30 ENCOUNTER — Other Ambulatory Visit: Payer: Self-pay

## 2019-05-30 DIAGNOSIS — Z87891 Personal history of nicotine dependence: Secondary | ICD-10-CM | POA: Diagnosis not present

## 2019-05-30 DIAGNOSIS — F17210 Nicotine dependence, cigarettes, uncomplicated: Secondary | ICD-10-CM | POA: Diagnosis not present

## 2019-06-02 ENCOUNTER — Encounter: Payer: Self-pay | Admitting: *Deleted

## 2019-06-02 ENCOUNTER — Encounter: Payer: Self-pay | Admitting: Internal Medicine

## 2019-06-02 DIAGNOSIS — I700 Atherosclerosis of aorta: Secondary | ICD-10-CM | POA: Insufficient documentation

## 2019-06-02 HISTORY — DX: Atherosclerosis of aorta: I70.0

## 2019-08-02 ENCOUNTER — Encounter: Payer: MEDICARE | Admitting: Dermatology

## 2019-08-10 ENCOUNTER — Ambulatory Visit
Admission: RE | Admit: 2019-08-10 | Discharge: 2019-08-10 | Disposition: A | Discharge status: Home / Self Care | Payer: MEDICARE | Source: Ambulatory Visit | Attending: Internal Medicine | Admitting: Internal Medicine

## 2019-08-10 DIAGNOSIS — Z1231 Encounter for screening mammogram for malignant neoplasm of breast: Secondary | ICD-10-CM

## 2019-08-24 ENCOUNTER — Telehealth: Payer: Self-pay | Admitting: Internal Medicine

## 2019-08-24 ENCOUNTER — Other Ambulatory Visit: Payer: Self-pay

## 2019-08-24 NOTE — Telephone Encounter (Signed)
PATIENT SCHEDULED TO COME IN 8/13 AT 10:00 AM FOR AN IN OFFICE APPOINTMENT.

## 2019-08-24 NOTE — Telephone Encounter (Signed)
Patient dropped off FMLA paper work. Forms are up front in color folder.

## 2019-08-25 ENCOUNTER — Encounter: Payer: Self-pay | Admitting: Internal Medicine

## 2019-08-25 ENCOUNTER — Ambulatory Visit (INDEPENDENT_AMBULATORY_CARE_PROVIDER_SITE_OTHER): Payer: MEDICARE | Admitting: Internal Medicine

## 2019-08-25 ENCOUNTER — Other Ambulatory Visit: Payer: Self-pay

## 2019-08-25 VITALS — BP 106/70 | HR 77 | Temp 98.0°F | Ht 62.6 in | Wt 121.0 lb

## 2019-08-25 DIAGNOSIS — Z1283 Encounter for screening for malignant neoplasm of skin: Secondary | ICD-10-CM | POA: Diagnosis not present

## 2019-08-25 DIAGNOSIS — Z1389 Encounter for screening for other disorder: Secondary | ICD-10-CM

## 2019-08-25 DIAGNOSIS — Z1231 Encounter for screening mammogram for malignant neoplasm of breast: Secondary | ICD-10-CM | POA: Diagnosis not present

## 2019-08-25 DIAGNOSIS — E785 Hyperlipidemia, unspecified: Secondary | ICD-10-CM | POA: Diagnosis not present

## 2019-08-25 DIAGNOSIS — I25118 Atherosclerotic heart disease of native coronary artery with other forms of angina pectoris: Secondary | ICD-10-CM

## 2019-08-25 DIAGNOSIS — Z0000 Encounter for general adult medical examination without abnormal findings: Secondary | ICD-10-CM

## 2019-08-25 DIAGNOSIS — Z1329 Encounter for screening for other suspected endocrine disorder: Secondary | ICD-10-CM | POA: Diagnosis not present

## 2019-08-25 DIAGNOSIS — L821 Other seborrheic keratosis: Secondary | ICD-10-CM

## 2019-08-25 DIAGNOSIS — E559 Vitamin D deficiency, unspecified: Secondary | ICD-10-CM

## 2019-08-25 DIAGNOSIS — I700 Atherosclerosis of aorta: Secondary | ICD-10-CM

## 2019-08-25 DIAGNOSIS — J449 Chronic obstructive pulmonary disease, unspecified: Secondary | ICD-10-CM | POA: Diagnosis not present

## 2019-08-25 MED ORDER — TRELEGY ELLIPTA 100-62.5-25 MCG/INH IN AEPB: 1.0000 | INHALATION_SPRAY | Freq: Every day | RESPIRATORY_TRACT | 4 refills | Status: DC

## 2019-08-25 MED ORDER — ALBUTEROL SULFATE HFA 108 (90 BASE) MCG/ACT IN AERS: 1.0000 | INHALATION_SPRAY | RESPIRATORY_TRACT | 3 refills | Status: DC | PRN

## 2019-08-25 MED ORDER — ATORVASTATIN CALCIUM 40 MG PO TABS: 40.0000 mg | ORAL_TABLET | Freq: Every day | ORAL | 3 refills | Status: DC

## 2019-08-25 NOTE — Patient Instructions (Addendum)

## 2019-08-25 NOTE — Progress Notes (Signed)
Chief Complaint  Patient presents with  . Follow-up  . Form Completion   F/u  Copd doing well still smoking but less on trelegy and prn albuteorl  hld on lipitor repeat labs 11/18/19    Review of Systems  Constitutional: Negative for weight loss.  HENT: Negative for hearing loss.   Eyes: Negative for blurred vision.  Respiratory: Negative for shortness of breath.   Cardiovascular: Negative for chest pain.  Gastrointestinal: Negative for abdominal pain.  Musculoskeletal: Negative for falls.  Skin: Negative for rash.  Neurological: Negative for headaches.  Psychiatric/Behavioral: Negative for depression and memory loss.   Past Medical History:  Diagnosis Date  . Allergy    cats   . Cancer ([place])    cervical  . Hyperlipidemia    Past Surgical History:  Procedure Laterality Date  . ABDOMINAL HYSTERECTOMY     1983  . COLONOSCOPY WITH PROPOFOL N/A 12/31/2017   Procedure: COLONOSCOPY WITH PROPOFOL;  Surgeon: Virgel Manifold, MD;  Location: [facility] ENDOSCOPY;  Service: Endoscopy;  Laterality: N/A;   Family History  Problem Relation Age of Onset  . Cancer Mother        lung cancer smoker died 33  . Muscular dystrophy Father   . Muscular dystrophy Sister   . Muscular dystrophy Sister   . Breast cancer Neg Hx    Social History   Socioeconomic History  . Marital status: Widowed    Spouse name: Not on file  . Number of children: Not on file  . Years of education: Not on file  . Highest education level: Not on file  Occupational History  . Not on file  Tobacco Use  . Smoking status: Current Some Day Smoker    Packs/day: 0.70    Years: 46.00    Pack years: 32.20  . Smokeless tobacco: Never Used  Substance and Sexual Activity  . Alcohol use: No  . Drug use: No  . Sexual activity: Never  Other Topics Concern  . Not on file  Social History Narrative   Widowed    12 grade education    Social Determinants of Health   Financial Resource Strain:   . Difficulty of  Paying Living Expenses:   Food Insecurity:   . Worried About Charity fundraiser in the Last Year:   . Arboriculturist in the Last Year:   Transportation Needs:   . Film/video editor (Medical):   Marland Kitchen Lack of Transportation (Non-Medical):   Physical Activity:   . Days of Exercise per Week:   . Minutes of Exercise per Session:   Stress:   . Feeling of Stress :   Social Connections:   . Frequency of Communication with Friends and Family:   . Frequency of Social Gatherings with Friends and Family:   . Attends Religious Services:   . Active Member of Clubs or Organizations:   . Attends Archivist Meetings:   Marland Kitchen Marital Status:   Intimate Partner Violence:   . Fear of Current or Ex-Partner:   . Emotionally Abused:   Marland Kitchen Physically Abused:   . Sexually Abused:    Current Meds  Medication Sig  . albuterol (VENTOLIN HFA) 108 (90 Base) MCG/ACT inhaler Inhale 1-2 puffs into the lungs every 4 (four) hours as needed for wheezing or shortness of breath.  Marland Kitchen aspirin EC 81 MG tablet Take 81 mg by mouth daily.  Marland Kitchen atorvastatin (LIPITOR) 40 MG tablet Take 1 tablet (40 mg total) by mouth  daily at 6 PM.  . Fluticasone-Umeclidin-Vilant (TRELEGY ELLIPTA) 100-62.5-25 MCG/INH AEPB Inhale 1 puff into the lungs daily. Rinse mouth  . [DISCONTINUED] albuterol (VENTOLIN HFA) 108 (90 Base) MCG/ACT inhaler Inhale 1-2 puffs into the lungs every 4 (four) hours as needed for wheezing or shortness of breath.  . [DISCONTINUED] atorvastatin (LIPITOR) 40 MG tablet Take 1 tablet (40 mg total) by mouth daily at 6 PM.  . [DISCONTINUED] Fluticasone-Umeclidin-Vilant (TRELEGY ELLIPTA) 100-62.5-25 MCG/INH AEPB Inhale 1 puff into the lungs daily. Rinse mouth   No Known Allergies No results found for this or any previous visit (from the past 2160 hour(s)). Objective  Body mass index is 21.71 kg/m. Wt Readings from Last 3 Encounters:  08/25/19 121 lb (54.9 kg)  05/30/19 128 lb (58.1 kg)  11/18/18 122 lb 6.4 oz  (55.5 kg)   Temp Readings from Last 3 Encounters:  08/25/19 98 F (36.7 C) (Oral)  11/18/18 97.7 F (36.5 C) (Oral)  12/31/17 (!) 96.9 F (36.1 C) (Tympanic)   BP Readings from Last 3 Encounters:  08/25/19 106/70  11/18/18 122/80  12/31/17 93/76   Pulse Readings from Last 3 Encounters:  08/25/19 77  11/18/18 96  12/31/17 75    Physical Exam Vitals and nursing note reviewed.  Constitutional:      Appearance: Normal appearance. She is well-developed and well-groomed. She is obese.  HENT:     Head: Normocephalic and atraumatic.  Eyes:     Conjunctiva/sclera: Conjunctivae normal.     Pupils: Pupils are equal, round, and reactive to light.  Cardiovascular:     Rate and Rhythm: Normal rate and regular rhythm.     Heart sounds: Normal heart sounds. No murmur heard.   Pulmonary:     Effort: Pulmonary effort is normal.     Breath sounds: Normal breath sounds.  Abdominal:     General: Abdomen is flat. Bowel sounds are normal.     Tenderness: There is no abdominal tenderness.  Skin:    General: Skin is warm and dry.  Neurological:     General: No focal deficit present.     Mental Status: She is alert and oriented to person, place, and time. Mental status is at baseline.     Gait: Gait normal.  Psychiatric:        Attention and Perception: Attention and perception normal.        Mood and Affect: Mood and affect normal.        Speech: Speech normal.        Behavior: Behavior normal. Behavior is cooperative.        Thought Content: Thought content normal.        Cognition and Memory: Cognition and memory normal.        Judgment: Judgment normal.     Assessment  Plan  Chronic obstructive pulmonary disease, unspecified COPD type ([place]) - Plan: Fluticasone-Umeclidin-Vilant (TRELEGY ELLIPTA) 100-62.5-25 MCG/INH AEPB, albuterol (VENTOLIN HFA) 108 (90 Base) MCG/ACT inhaler  Hyperlipidemia, unspecified hyperlipidemia type - Plan: atorvastatin (LIPITOR) 40 MG tablet,  Comprehensive metabolic panel, Lipid panel  SK (seborrheic keratosis) - Plan: Ambulatory referral to Dermatology  Skin cancer screening - Plan: Ambulatory referral to Dermatology   HM Had flu shotutd  Had pna12 09/04/17 Tdap 05/17/18 prevnar may have had 06/22/16  zostervax 01/23/09 disc shingrix had 2/2  covid 2/2   Declines MMR check Consider hep B vaccine Hep C neg  mammo neg 08/09/18 neg 08/10/19  Ordered  Pap s/p hysterectomy had 1 nl  years ago with ho abnormal; pap s/p hysterectomy nl per pt  Colonoscopy  12/31/17 normal Dr. Darene Lamer DEXA 04/2012 normal ? If others with Dr. Lavera Guise. DEXA 10/12/17 normal +smoker rec cessation smoking 20-30 years h/o lung cancer in mother max 4 pks/week now 1-2 pks/week. -CT scan due 05/24/18 h/o lung nodule and copd noted in smoker repeat in 1 year rec cessation   Provider: Dr. Olivia Mackie McLean-Scocuzza-Internal Medicine

## 2019-08-30 ENCOUNTER — Telehealth: Payer: Self-pay | Admitting: Internal Medicine

## 2019-08-30 NOTE — Telephone Encounter (Signed)
Patient's daughter dropped off FMLA paper work. Forms are up front in Dr. Claris Gladden color folder.

## 2019-09-04 NOTE — Telephone Encounter (Signed)
Forms no longer in folder. Did you receive these?

## 2019-09-17 NOTE — Telephone Encounter (Signed)
We discussed the forms again as they were dropped off by daughter after I discussed with pt I cant fill these out for her daughter must be her daughters PCP and I also gave the pt a letter for her daughter to give to her PCP  There is nothing acute going on with the patient currently to need FMLA. If she wants intermittent leave to come to her moms appts this must be done by the daughters PCP as I am not her PCP only the moms    I think you had the papers again but Im not sure if you ever gave then to me after our discuss  Please check your work area and inform pts daughter   [place]

## 2019-09-19 NOTE — Telephone Encounter (Signed)
Had spoken with patient's daughter over the phone after she dropped this paperwork off again. Informed her of the below. She verbalized understanding and gave me permission to shred these papers for her. Stated she did not want them mailed and di not want to come and pick these up.

## 2019-09-27 ENCOUNTER — Other Ambulatory Visit: Payer: Self-pay

## 2019-09-27 ENCOUNTER — Ambulatory Visit (INDEPENDENT_AMBULATORY_CARE_PROVIDER_SITE_OTHER): Payer: MEDICARE | Admitting: Dermatology

## 2019-09-27 DIAGNOSIS — L821 Other seborrheic keratosis: Secondary | ICD-10-CM

## 2019-09-27 DIAGNOSIS — L814 Other melanin hyperpigmentation: Secondary | ICD-10-CM

## 2019-09-27 DIAGNOSIS — L578 Other skin changes due to chronic exposure to nonionizing radiation: Secondary | ICD-10-CM

## 2019-09-27 DIAGNOSIS — D692 Other nonthrombocytopenic purpura: Secondary | ICD-10-CM

## 2019-09-27 DIAGNOSIS — D1800 Hemangioma unspecified site: Secondary | ICD-10-CM

## 2019-09-27 DIAGNOSIS — I781 Nevus, non-neoplastic: Secondary | ICD-10-CM

## 2019-09-27 NOTE — Progress Notes (Signed)
   New Patient Visit  Subjective  Brittany Burch is a 69 y.o. female who presents for the following: lesions (on the face - brown spots, but no other symptoms).  The following portions of the chart were reviewed this encounter and updated as appropriate:     Review of Systems:  No other skin or systemic complaints except as noted in HPI or Assessment and Plan.  Objective  Well appearing patient in no apparent distress; mood and affect are within normal limits.  A focused examination was performed including the face, arms, back, and chest. Relevant physical exam findings are noted in the Assessment and Plan.  Objective  central forehead: Pink white macule, no induration or textural change - blanches with pressure  Assessment & Plan  Telangiectasia central forehead  Benign, observe.    Actinic Damage - face, chest - diffuse dyspigmentation with facial elastosis and rhytides - Recommend daily broad spectrum sunscreen SPF 30+ to sun-exposed areas, reapply every 2 hours as needed. Samples given of Neutrogena, Aveeno, and Eucerin daily moisturizers with sunscreen.  - Call for new or changing lesions.  Lentigines - face, back, chest  - Scattered tan/brown macules - Discussed due to sun exposure - Benign, observe, rec. daily moisturizer with sunscreen - Call for any changes  Purpura - arms - Violaceous macules and patches - Benign - Related to age, sun damage and/or use of blood thinners - Observe - Can use OTC arnica containing moisturizer such as Dermend Bruise Formula if desired - Call for worsening or other concerns  Seborrheic Keratoses - face, back, chest  - Stuck-on, waxy, tan-brown macules and papules  - Discussed benign etiology and prognosis. - Observe - Call for any changes  Hemangiomas - chest  - Red papules - Discussed benign nature - Observe - Call for any changes  Return if symptoms worsen or fail to improve.  Luther Redo, CMA, am acting as  scribe for Brendolyn Patty, MD .  Documentation: I have reviewed the above documentation for accuracy and completeness, and I agree with the above.  Brendolyn Patty MD

## 2019-09-27 NOTE — Patient Instructions (Signed)

## 2019-10-04 ENCOUNTER — Encounter: Payer: Self-pay | Admitting: Internal Medicine

## 2019-11-21 ENCOUNTER — Other Ambulatory Visit: Payer: Self-pay

## 2019-11-21 ENCOUNTER — Other Ambulatory Visit (INDEPENDENT_AMBULATORY_CARE_PROVIDER_SITE_OTHER): Payer: MEDICARE

## 2019-11-21 ENCOUNTER — Ambulatory Visit: Payer: TRICARE For Life (TFL) | Admitting: Internal Medicine

## 2019-11-21 DIAGNOSIS — Z0000 Encounter for general adult medical examination without abnormal findings: Secondary | ICD-10-CM

## 2019-11-21 DIAGNOSIS — Z1329 Encounter for screening for other suspected endocrine disorder: Secondary | ICD-10-CM

## 2019-11-21 DIAGNOSIS — Z1389 Encounter for screening for other disorder: Secondary | ICD-10-CM

## 2019-11-21 DIAGNOSIS — E785 Hyperlipidemia, unspecified: Secondary | ICD-10-CM | POA: Diagnosis not present

## 2019-11-21 DIAGNOSIS — E559 Vitamin D deficiency, unspecified: Secondary | ICD-10-CM | POA: Diagnosis not present

## 2019-11-21 LAB — LIPID PANEL
Cholesterol: 169 mg/dL (ref 0–200)
HDL: 48.4 mg/dL (ref >39.00)
LDL Cholesterol: 102 mg/dL — ABNORMAL HIGH (ref 0–99)
NonHDL: 121
Total CHOL/HDL Ratio: 4
Triglycerides: 97 mg/dL (ref 0.0–149.0)
VLDL: 19.4 mg/dL (ref 0.0–40.0)

## 2019-11-21 LAB — COMPREHENSIVE METABOLIC PANEL
ALT: 12 U/L (ref 0–35)
AST: 16 U/L (ref 0–37)
Albumin: 4.2 g/dL (ref 3.5–5.2)
Alkaline Phosphatase: 82 U/L (ref 39–117)
BUN: 15 mg/dL (ref 6–23)
CO2: 30 mEq/L (ref 19–32)
Calcium: 9.3 mg/dL (ref 8.4–10.5)
Chloride: 102 mEq/L (ref 96–112)
Creatinine, Ser: 0.71 mg/dL (ref 0.40–1.20)
GFR: 85.98 mL/min (ref >60.00)
Glucose, Bld: 84 mg/dL (ref 70–99)
Potassium: 4.5 mEq/L (ref 3.5–5.1)
Sodium: 139 mEq/L (ref 135–145)
Total Bilirubin: 0.4 mg/dL (ref 0.2–1.2)
Total Protein: 6.7 g/dL (ref 6.0–8.3)

## 2019-11-21 LAB — CBC WITH DIFFERENTIAL/PLATELET
Basophils Absolute: 0 10*3/uL (ref 0.0–0.1)
Basophils Relative: 0.5 % (ref 0.0–3.0)
Eosinophils Absolute: 0.1 10*3/uL (ref 0.0–0.7)
Eosinophils Relative: 1 % (ref 0.0–5.0)
HCT: 40.9 % (ref 36.0–46.0)
Hemoglobin: 13.7 g/dL (ref 12.0–15.0)
Lymphocytes Relative: 25.9 % (ref 12.0–46.0)
Lymphs Abs: 2.1 10*3/uL (ref 0.7–4.0)
MCHC: 33.6 g/dL (ref 30.0–36.0)
MCV: 92.2 fl (ref 78.0–100.0)
Monocytes Absolute: 0.6 10*3/uL (ref 0.1–1.0)
Monocytes Relative: 7.2 % (ref 3.0–12.0)
Neutro Abs: 5.3 10*3/uL (ref 1.4–7.7)
Neutrophils Relative %: 65.4 % (ref 43.0–77.0)
Platelets: 282 10*3/uL (ref 150.0–400.0)
RBC: 4.44 Mil/uL (ref 3.87–5.11)
RDW: 13.4 % (ref 11.5–15.5)
WBC: 8.1 10*3/uL (ref 4.0–10.5)

## 2019-11-21 LAB — TSH: TSH: 1.05 u[IU]/mL (ref 0.35–4.50)

## 2019-11-21 LAB — VITAMIN D 25 HYDROXY (VIT D DEFICIENCY, FRACTURES): VITD: 31.02 ng/mL (ref 30.00–100.00)

## 2019-11-22 LAB — URINALYSIS, ROUTINE W REFLEX MICROSCOPIC
Bilirubin Urine: NEGATIVE
Glucose, UA: NEGATIVE
Hgb urine dipstick: NEGATIVE
Ketones, ur: NEGATIVE
Leukocytes,Ua: NEGATIVE
Nitrite: NEGATIVE
Protein, ur: NEGATIVE
Specific Gravity, Urine: 1.009 (ref 1.001–1.03)
pH: 6.5 (ref 5.0–8.0)

## 2019-11-27 ENCOUNTER — Ambulatory Visit (INDEPENDENT_AMBULATORY_CARE_PROVIDER_SITE_OTHER): Payer: MEDICARE

## 2019-11-27 VITALS — Ht 62.6 in | Wt 121.0 lb

## 2019-11-27 DIAGNOSIS — Z0000 Encounter for general adult medical examination without abnormal findings: Secondary | ICD-10-CM

## 2019-11-27 NOTE — Progress Notes (Signed)
Subjective:   Brittany Burch is a 69 y.o. female who presents for Medicare Annual (Subsequent) preventive examination.  Review of Systems    No ROS.  Medicare Wellness Virtual Visit.    Cardiac Risk Factors include: advanced age (>34men, >40 women)     Objective:    Today's Vitals   11/27/19 0937  Weight: 121 lb (54.9 kg)  Height: 5' 2.6" (1.59 m)   Body mass index is 21.71 kg/m.  Advanced Directives 11/27/2019 11/24/2018 12/31/2017 11/17/2017  Does Patient Have a Medical Advance Directive? Yes Yes Yes No  Type of Advance Directive Healthcare Power of Attorney Living will Living will -  Does patient want to make changes to medical advance directive? No - Patient declined - - -  Copy of [place] in Chart? No - copy requested - - -  Would patient like information on creating a medical advance directive? - - - Yes (MAU/Ambulatory/Procedural Areas - Information given)    Current Medications (verified) Outpatient Encounter Medications as of 11/27/2019  Medication Sig  . albuterol (VENTOLIN HFA) 108 (90 Base) MCG/ACT inhaler Inhale 1-2 puffs into the lungs every 4 (four) hours as needed for wheezing or shortness of breath.  Marland Kitchen aspirin EC 81 MG tablet Take 81 mg by mouth daily.  Marland Kitchen atorvastatin (LIPITOR) 40 MG tablet Take 1 tablet (40 mg total) by mouth daily at 6 PM.  . Fluticasone-Umeclidin-Vilant (TRELEGY ELLIPTA) 100-62.5-25 MCG/INH AEPB Inhale 1 puff into the lungs daily. Rinse mouth   No facility-administered encounter medications on file as of 11/27/2019.    Allergies (verified) Patient has no known allergies.   History: Past Medical History:  Diagnosis Date  . Allergy    cats   . Cancer ([place])    cervical  . Hyperlipidemia    Past Surgical History:  Procedure Laterality Date  . ABDOMINAL HYSTERECTOMY     1983  . COLONOSCOPY WITH PROPOFOL N/A 12/31/2017   Procedure: COLONOSCOPY WITH PROPOFOL;  Surgeon: Virgel Manifold, MD;   Location: [facility] ENDOSCOPY;  Service: Endoscopy;  Laterality: N/A;   Family History  Problem Relation Age of Onset  . Cancer Mother        lung cancer smoker died 40  . Muscular dystrophy Father   . Muscular dystrophy Sister   . Muscular dystrophy Sister   . Breast cancer Neg Hx    Social History   Socioeconomic History  . Marital status: Widowed    Spouse name: Not on file  . Number of children: Not on file  . Years of education: Not on file  . Highest education level: Not on file  Occupational History  . Not on file  Tobacco Use  . Smoking status: Current Some Day Smoker    Packs/day: 0.70    Years: 46.00    Pack years: 32.20  . Smokeless tobacco: Never Used  Substance and Sexual Activity  . Alcohol use: No  . Drug use: No  . Sexual activity: Never  Other Topics Concern  . Not on file  Social History Narrative   Widowed    12 grade education    Social Determinants of Health   Financial Resource Strain: Low Risk   . Difficulty of Paying Living Expenses: Not hard at all  Food Insecurity: No Food Insecurity  . Worried About Charity fundraiser in the Last Year: Never true  . Ran Out of Food in the Last Year: Never true  Transportation Needs: No Transportation Needs  .  Lack of Transportation (Medical): No  . Lack of Transportation (Non-Medical): No  Physical Activity:   . Days of Exercise per Week: Not on file  . Minutes of Exercise per Session: Not on file  Stress: No Stress Concern Present  . Feeling of Stress : Not at all  Social Connections: Unknown  . Frequency of Communication with Friends and Family: More than three times a week  . Frequency of Social Gatherings with Friends and Family: More than three times a week  . Attends Religious Services: Not on file  . Active Member of Clubs or Organizations: Not on file  . Attends Archivist Meetings: Not on file  . Marital Status: Not on file    Tobacco Counseling Ready to quit: Not  Answered Counseling given: Not Answered   Clinical Intake:  Pre-visit preparation completed: Yes        Diabetes: No  How often do you need to have someone help you when you read instructions, pamphlets, or other written materials from your doctor or pharmacy?: 1 - Never  Interpreter Needed?: No      Activities of Daily Living In your present state of health, do you have any difficulty performing the following activities: 11/27/2019  Hearing? N  Vision? N  Difficulty concentrating or making decisions? N  Walking or climbing stairs? N  Dressing or bathing? N  Doing errands, shopping? N  Preparing Food and eating ? N  Using the Toilet? N  In the past six months, have you accidently leaked urine? N  Do you have problems with loss of bowel control? N  Managing your Medications? N  Managing your Finances? N  Housekeeping or managing your Housekeeping? N  Some recent data might be hidden    Patient Care Team: McLean-Scocuzza, Nino Glow, MD as PCP - General (Internal Medicine)  Indicate any recent Medical Services you may have received from other than [facility] providers in the past year (date may be approximate).     Assessment:   This is a routine wellness examination for [place].  I connected with Kashia today by telephone and verified that I am speaking with the correct person using two identifiers. Location patient: home Location provider: work Persons participating in the virtual visit: patient, Marine scientist.    I discussed the limitations, risks, security and privacy concerns of performing an evaluation and management service by telephone and the availability of in person appointments. The patient expressed understanding and verbally consented to this telephonic visit.    Interactive audio and video telecommunications were attempted between this provider and patient, however failed, due to patient having technical difficulties OR patient did not have access to video  capability.  We continued and completed visit with audio only.  Some vital signs may be absent or patient reported.   Hearing/Vision screen  Hearing Screening   125Hz  250Hz  500Hz  1000Hz  2000Hz  3000Hz  4000Hz  6000Hz  8000Hz   Right ear:           Left ear:           Comments: Patient is able to hear conversational tones without difficulty.  No issues reported.  Vision Screening Comments: Wears corrective lenses Visual acuity not assessed, virtual visit.       Dietary issues and exercise activities discussed: Current Exercise Habits: Home exercise routine, Type of exercise: walking, Intensity: Mild  Healthy diet Good water intake  Goals    . Maintain Healthy Lifestyle     Stay active Healthy diet  Depression Screen PHQ 2/9 Scores 11/27/2019 08/25/2019 11/24/2018 11/18/2018 11/17/2017 02/16/2017  PHQ - 2 Score 0 0 0 0 0 0    Fall Risk Fall Risk  11/27/2019 08/25/2019 11/24/2018 11/18/2018 11/17/2017  Falls in the past year? 0 0 0 0 0  Number falls in past yr: 0 0 - - -  Injury with Fall? - 0 - - -  Follow up Falls evaluation completed Falls evaluation completed - - -   Handrails in use when climbing stairs? Yes Home free of loose throw rugs in walkways, pet beds, electrical cords, etc? Yes  Adequate lighting in your home to reduce risk of falls? Yes   ASSISTIVE DEVICES UTILIZED TO PREVENT FALLS: Use of a cane, walker or w/c? No   TIMED UP AND GO: Was the test performed? No . Virtual visit.   Cognitive Function: MMSE - Mini Mental State Exam 11/17/2017  Orientation to time 5  Orientation to Place 5  Registration 3  Attention/ Calculation 5  Recall 3  Language- name 2 objects 2  Language- repeat 1  Language- follow 3 step command 3  Language- read & follow direction 1  Write a sentence 1  Copy design 1  Total score 30     6CIT Screen 11/27/2019 11/24/2018  What Year? 0 points 0 points  What month? 0 points 0 points  What time? 0 points 0 points  Count back  from 20 0 points 0 points  Months in reverse 0 points 0 points  Repeat phrase 0 points 0 points  Total Score 0 0    Immunizations Immunization History  Administered Date(s) Administered  . Influenza, High Dose Seasonal PF 09/24/2017  . Influenza-Unspecified 10/17/2018  . Moderna SARS-COVID-2 Vaccination 03/11/2019, 04/08/2019  . Pneumococcal Conjugate-13 06/22/2016  . Pneumococcal Polysaccharide-23 09/04/2017  . Tdap 05/17/2018  . Zoster Recombinat (Shingrix) 11/23/2017, 01/31/2018   Health Maintenance Health Maintenance  Topic Date Due  . INFLUENZA VACCINE  04/11/2020 (Originally 08/13/2019)  . MAMMOGRAM  08/09/2021  . COLONOSCOPY  01/01/2028  . TETANUS/TDAP  05/16/2028  . DEXA SCAN  Completed  . COVID-19 Vaccine  Completed  . Hepatitis C Screening  Completed  . PNA vac Low Risk Adult  Completed   Notes influenza received in office last visit. Deferred for follow up.   Colorectal cancer screening: Completed 12/31/17. Repeat every 10 years   Mammogram status: Completed 08/10/19. Repeat every year. 3D Screen Bilateral.  Bone Density- completed 10/12/17. Repeat every 2-5 years.   Lung Cancer Screening: (Low Dose CT Chest recommended if Age 69-80 years, 30 pack-year currently smoking OR have quit w/in 15years.) does qualify. Completed 05/30/19.   Hepatitis C Screening: Completed 04/14/17.   Dental Screening: Recommended annual dental exams for proper oral hygiene  Community Resource Referral / Chronic Care Management: CRR required this visit?  No   CCM required this visit?  No      Plan:   Keep all routine maintenance appointments.   Follow up 08/27/20 @ 9:00.  I have personally reviewed and noted the following in the patient's chart:   . Medical and social history . Use of alcohol, tobacco or illicit drugs  . Current medications and supplements . Functional ability and status . Nutritional status . Physical activity . Advanced directives . List of other  physicians . Hospitalizations, surgeries, and ER visits in previous 12 months . Vitals . Screenings to include cognitive, depression, and falls . Referrals and appointments  In addition, I have reviewed and discussed  with patient certain preventive protocols, quality metrics, and best practice recommendations. A written personalized care plan for preventive services as well as general preventive health recommendations were provided to patient via mail.     Varney Biles, LPN   [phone]

## 2019-11-27 NOTE — Patient Instructions (Addendum)
Brittany Burch , Thank you for taking time to come for your Medicare Wellness Visit. I appreciate your ongoing commitment to your health goals. Please review the following plan we discussed and let me know if I can assist you in the future.   These are the goals we discussed: Goals    . Maintain Healthy Lifestyle     Stay active Healthy diet       This is a list of the screening recommended for you and due dates:  Health Maintenance  Topic Date Due  . Flu Shot  04/11/2020*  . Mammogram  08/09/2021  . Colon Cancer Screening  01/01/2028  . Tetanus Vaccine  05/16/2028  . DEXA scan (bone density measurement)  Completed  . COVID-19 Vaccine  Completed  .  Hepatitis C: One time screening is recommended by Center for Disease Control  (CDC) for  adults born from [birth year] through 1966.   Completed  . Pneumonia vaccines  Completed  *Topic was postponed. The date shown is not the original due date.    Immunizations Immunization History  Administered Date(s) Administered  . Influenza, High Dose Seasonal PF 09/24/2017  . Influenza-Unspecified 10/17/2018  . Moderna SARS-COVID-2 Vaccination 03/11/2019, 04/08/2019  . Pneumococcal Conjugate-13 06/22/2016  . Pneumococcal Polysaccharide-23 09/04/2017  . Tdap 05/17/2018  . Zoster Recombinat (Shingrix) 11/23/2017, 01/31/2018   Keep all routine maintenance appointments.   Follow up 08/27/20 @ 9:00.  Advanced directives: End of life planning; Advance aging; Advanced directives discussed.  Copy of current HCPOA/Living Will requested.    Conditions/risks identified: none new.  Follow up in one year for your annual wellness visit.   Preventive Care 78 Years and Older, Female Preventive care refers to lifestyle choices and visits with your health care provider that can promote health and wellness. What does preventive care include?  A yearly physical exam. This is also called an annual well check.  Dental exams once or twice a year.  Routine  eye exams. Ask your health care provider how often you should have your eyes checked.  Personal lifestyle choices, including:  Daily care of your teeth and gums.  Regular physical activity.  Eating a healthy diet.  Avoiding tobacco and drug use.  Limiting alcohol use.  Practicing safe sex.  Taking low-dose aspirin every day.  Taking vitamin and mineral supplements as recommended by your health care provider. What happens during an annual well check? The services and screenings done by your health care provider during your annual well check will depend on your age, overall health, lifestyle risk factors, and family history of disease. Counseling  Your health care provider may ask you questions about your:  Alcohol use.  Tobacco use.  Drug use.  Emotional well-being.  Home and relationship well-being.  Sexual activity.  Eating habits.  History of falls.  Memory and ability to understand (cognition).  Work and work Statistician.  Reproductive health. Screening  You may have the following tests or measurements:  Height, weight, and BMI.  Blood pressure.  Lipid and cholesterol levels. These may be checked every 5 years, or more frequently if you are over 41 years old.  Skin check.  Lung cancer screening. You may have this screening every year starting at age 53 if you have a 30-pack-year history of smoking and currently smoke or have quit within the past 15 years.  Fecal occult blood test (FOBT) of the stool. You may have this test every year starting at age 49.  Flexible sigmoidoscopy or colonoscopy.  You may have a sigmoidoscopy every 5 years or a colonoscopy every 10 years starting at age 49.  Hepatitis C blood test.  Hepatitis B blood test.  Sexually transmitted disease (STD) testing.  Diabetes screening. This is done by checking your blood sugar (glucose) after you have not eaten for a while (fasting). You may have this done every 1-3 years.  Bone  density scan. This is done to screen for osteoporosis. You may have this done starting at age 70.  Mammogram. This may be done every 1-2 years. Talk to your health care provider about how often you should have regular mammograms. Talk with your health care provider about your test results, treatment options, and if necessary, the need for more tests. Vaccines  Your health care provider may recommend certain vaccines, such as:  Influenza vaccine. This is recommended every year.  Tetanus, diphtheria, and acellular pertussis (Tdap, Td) vaccine. You may need a Td booster every 10 years.  Zoster vaccine. You may need this after age 35.  Pneumococcal 13-valent conjugate (PCV13) vaccine. One dose is recommended after age 7.  Pneumococcal polysaccharide (PPSV23) vaccine. One dose is recommended after age 92. Talk to your health care provider about which screenings and vaccines you need and how often you need them. This information is not intended to replace advice given to you by your health care provider. Make sure you discuss any questions you have with your health care provider. Document Released: 01/25/2015 Document Revised: 09/18/2015 Document Reviewed: 10/30/2014 Elsevier Interactive Patient Education  2017 [place] Prevention in the Home Falls can cause injuries. They can happen to people of all ages. There are many things you can do to make your home safe and to help prevent falls. What can I do on the outside of my home?  Regularly fix the edges of walkways and driveways and fix any cracks.  Remove anything that might make you trip as you walk through a door, such as a raised step or threshold.  Trim any bushes or trees on the path to your home.  Use bright outdoor lighting.  Clear any walking paths of anything that might make someone trip, such as rocks or tools.  Regularly check to see if handrails are loose or broken. Make sure that both sides of any steps have  handrails.  Any raised decks and porches should have guardrails on the edges.  Have any leaves, snow, or ice cleared regularly.  Use sand or salt on walking paths during winter.  Clean up any spills in your garage right away. This includes oil or grease spills. What can I do in the bathroom?  Use night lights.  Install grab bars by the toilet and in the tub and shower. Do not use towel bars as grab bars.  Use non-skid mats or decals in the tub or shower.  If you need to sit down in the shower, use a plastic, non-slip stool.  Keep the floor dry. Clean up any water that spills on the floor as soon as it happens.  Remove soap buildup in the tub or shower regularly.  Attach bath mats securely with double-sided non-slip rug tape.  Do not have throw rugs and other things on the floor that can make you trip. What can I do in the bedroom?  Use night lights.  Make sure that you have a light by your bed that is easy to reach.  Do not use any sheets or blankets that are too big  for your bed. They should not hang down onto the floor.  Have a firm chair that has side arms. You can use this for support while you get dressed.  Do not have throw rugs and other things on the floor that can make you trip. What can I do in the kitchen?  Clean up any spills right away.  Avoid walking on wet floors.  Keep items that you use a lot in easy-to-reach places.  If you need to reach something above you, use a strong step stool that has a grab bar.  Keep electrical cords out of the way.  Do not use floor polish or wax that makes floors slippery. If you must use wax, use non-skid floor wax.  Do not have throw rugs and other things on the floor that can make you trip. What can I do with my stairs?  Do not leave any items on the stairs.  Make sure that there are handrails on both sides of the stairs and use them. Fix handrails that are broken or loose. Make sure that handrails are as long as  the stairways.  Check any carpeting to make sure that it is firmly attached to the stairs. Fix any carpet that is loose or worn.  Avoid having throw rugs at the top or bottom of the stairs. If you do have throw rugs, attach them to the floor with carpet tape.  Make sure that you have a light switch at the top of the stairs and the bottom of the stairs. If you do not have them, ask someone to add them for you. What else can I do to help prevent falls?  Wear shoes that:  Do not have high heels.  Have rubber bottoms.  Are comfortable and fit you well.  Are closed at the toe. Do not wear sandals.  If you use a stepladder:  Make sure that it is fully opened. Do not climb a closed stepladder.  Make sure that both sides of the stepladder are locked into place.  Ask someone to hold it for you, if possible.  Clearly mark and make sure that you can see:  Any grab bars or handrails.  First and last steps.  Where the edge of each step is.  Use tools that help you move around (mobility aids) if they are needed. These include:  Canes.  Walkers.  Scooters.  Crutches.  Turn on the lights when you go into a dark area. Replace any light bulbs as soon as they burn out.  Set up your furniture so you have a clear path. Avoid moving your furniture around.  If any of your floors are uneven, fix them.  If there are any pets around you, be aware of where they are.  Review your medicines with your doctor. Some medicines can make you feel dizzy. This can increase your chance of falling. Ask your doctor what other things that you can do to help prevent falls. This information is not intended to replace advice given to you by your health care provider. Make sure you discuss any questions you have with your health care provider. Document Released: 10/25/2008 Document Revised: 06/06/2015 Document Reviewed: 02/02/2014 Elsevier Interactive Patient Education  2017 [employer].

## 2019-12-01 NOTE — Progress Notes (Signed)
I have reviewed the above note and agree.  Kori Goins, M.D.  

## 2019-12-15 ENCOUNTER — Ambulatory Visit (INDEPENDENT_AMBULATORY_CARE_PROVIDER_SITE_OTHER): Payer: MEDICARE

## 2019-12-15 ENCOUNTER — Other Ambulatory Visit: Payer: Self-pay

## 2019-12-15 DIAGNOSIS — Z23 Encounter for immunization: Secondary | ICD-10-CM | POA: Diagnosis not present

## 2020-05-08 ENCOUNTER — Encounter: Payer: Self-pay | Admitting: *Deleted

## 2020-05-08 ENCOUNTER — Telehealth: Payer: Self-pay | Admitting: *Deleted

## 2020-05-08 DIAGNOSIS — Z87891 Personal history of nicotine dependence: Secondary | ICD-10-CM

## 2020-05-08 DIAGNOSIS — Z122 Encounter for screening for malignant neoplasm of respiratory organs: Secondary | ICD-10-CM

## 2020-05-08 DIAGNOSIS — F17200 Nicotine dependence, unspecified, uncomplicated: Secondary | ICD-10-CM

## 2020-05-08 NOTE — Telephone Encounter (Signed)
Spoke to patient via telephone re: scheduling her annual lung screening scan. Current smoker, 0.75 ppd x 46 yrs. Scan scheduled for 05/30/20 @ 9:30 am.

## 2020-05-30 ENCOUNTER — Other Ambulatory Visit: Payer: Self-pay

## 2020-05-30 ENCOUNTER — Ambulatory Visit
Admission: RE | Admit: 2020-05-30 | Discharge: 2020-05-30 | Disposition: A | Discharge status: Home / Self Care | Payer: MEDICARE | Source: Ambulatory Visit | Attending: Internal Medicine | Admitting: Internal Medicine

## 2020-05-30 DIAGNOSIS — I2510 Atherosclerotic heart disease of native coronary artery without angina pectoris: Secondary | ICD-10-CM | POA: Diagnosis not present

## 2020-05-30 DIAGNOSIS — Z122 Encounter for screening for malignant neoplasm of respiratory organs: Secondary | ICD-10-CM | POA: Insufficient documentation

## 2020-05-30 DIAGNOSIS — J439 Emphysema, unspecified: Secondary | ICD-10-CM | POA: Insufficient documentation

## 2020-05-30 DIAGNOSIS — I700 Atherosclerosis of aorta: Secondary | ICD-10-CM | POA: Insufficient documentation

## 2020-05-30 DIAGNOSIS — Z801 Family history of malignant neoplasm of trachea, bronchus and lung: Secondary | ICD-10-CM | POA: Diagnosis not present

## 2020-05-30 DIAGNOSIS — F17210 Nicotine dependence, cigarettes, uncomplicated: Secondary | ICD-10-CM | POA: Diagnosis not present

## 2020-05-30 DIAGNOSIS — F17200 Nicotine dependence, unspecified, uncomplicated: Secondary | ICD-10-CM | POA: Insufficient documentation

## 2020-06-03 NOTE — Addendum Note (Signed)
Addended by: Orland Mustard on: 06/03/2020 12:32 PM   Modules accepted: Orders

## 2020-06-12 ENCOUNTER — Encounter: Payer: Self-pay | Admitting: *Deleted

## 2020-07-12 ENCOUNTER — Other Ambulatory Visit: Payer: Self-pay

## 2020-07-12 ENCOUNTER — Ambulatory Visit (INDEPENDENT_AMBULATORY_CARE_PROVIDER_SITE_OTHER): Payer: MEDICARE | Admitting: Cardiology

## 2020-07-12 ENCOUNTER — Encounter: Payer: Self-pay | Admitting: Cardiology

## 2020-07-12 VITALS — BP 116/68 | HR 85 | Ht 63.0 in | Wt 118.5 lb

## 2020-07-12 DIAGNOSIS — E7800 Pure hypercholesterolemia, unspecified: Secondary | ICD-10-CM

## 2020-07-12 DIAGNOSIS — F17200 Nicotine dependence, unspecified, uncomplicated: Secondary | ICD-10-CM | POA: Diagnosis not present

## 2020-07-12 DIAGNOSIS — I2510 Atherosclerotic heart disease of native coronary artery without angina pectoris: Secondary | ICD-10-CM | POA: Diagnosis not present

## 2020-07-12 NOTE — Progress Notes (Signed)
Cardiology Office Note:    Date:  07/12/2020   ID:  Brittany Burch, DOB 1950-09-13, MRN [id number]  PCP:  McLean-Scocuzza, Brittany Glow, MD   [place] Providers Cardiologist:  Brittany Sable, MD     Referring MD: McLean-Scocuzza, Brittany Burch *   Chief Complaint  Patient presents with   New Patient (Initial Visit)    Ref by Dr. Olivia Burch McLean-Scocuzza for Aortic atherosclerosis. Medications reviewed by the patient verbally.      History of Present Illness:    Brittany Burch is a 70 y.o. female with a hx of hyperlipidemia, current smoker 30+yrs who presents due to coronary artery disease.  Patient is a current smoker, obtains CT chest lung cancer screening periodically.  Last CT obtained 05/30/2020 showed LAD calcification.  Emphysema also noted.  She denies chest pain or shortness of breath at rest or with exertion.  Denies any history of heart disease.  Currently takes aspirin and Lipitor as prescribed, no adverse effects.  Feels well, still smokes.  Past Medical History:  Diagnosis Date   Allergy    cats    Cancer ([place])    cervical   Hyperlipidemia     Past Surgical History:  Procedure Laterality Date   ABDOMINAL HYSTERECTOMY     1983   COLONOSCOPY WITH PROPOFOL N/A 12/31/2017   Procedure: COLONOSCOPY WITH PROPOFOL;  Surgeon: Virgel Manifold, MD;  Location: [facility] ENDOSCOPY;  Service: Endoscopy;  Laterality: N/A;    Current Medications: Current Meds  Medication Sig   albuterol (VENTOLIN HFA) 108 (90 Base) MCG/ACT inhaler Inhale 1-2 puffs into the lungs every 4 (four) hours as needed for wheezing or shortness of breath.   aspirin EC 81 MG tablet Take 81 mg by mouth daily.   atorvastatin (LIPITOR) 40 MG tablet Take 1 tablet (40 mg total) by mouth daily at 6 PM.   Fluticasone-Umeclidin-Vilant (TRELEGY ELLIPTA) 100-62.5-25 MCG/INH AEPB Inhale 1 puff into the lungs daily. Rinse mouth     Allergies:   Patient has no known allergies.   Social History    Socioeconomic History   Marital status: Widowed    Spouse name: Not on file   Number of children: Not on file   Years of education: Not on file   Highest education level: Not on file  Occupational History   Not on file  Tobacco Use   Smoking status: Every Day    Packs/day: 0.75    Years: 46.00    Pack years: 34.50    Types: Cigarettes   Smokeless tobacco: Never   Tobacco comments:    only smokes a few a day now  Vaping Use   Vaping Use: Never used  Substance and Sexual Activity   Alcohol use: No   Drug use: No   Sexual activity: Never  Other Topics Concern   Not on file  Social History Narrative   Widowed    12 grade education    Social Determinants of Health   Financial Resource Strain: Low Risk    Difficulty of Paying Living Expenses: Not hard at all  Food Insecurity: No Food Insecurity   Worried About Charity fundraiser in the Last Year: Never true   Ran Out of Food in the Last Year: Never true  Transportation Needs: No Transportation Needs   Lack of Transportation (Medical): No   Lack of Transportation (Non-Medical): No  Physical Activity: Not on file  Stress: No Stress Concern Present   Feeling of Stress : Not at  all  Social Connections: Unknown   Frequency of Communication with Friends and Family: More than three times a week   Frequency of Social Gatherings with Friends and Family: More than three times a week   Attends Religious Services: Not on Electrical engineer or Organizations: Not on file   Attends Archivist Meetings: Not on file   Marital Status: Not on file     Family History: The patient's family history includes Cancer in her mother; Muscular dystrophy in her father, sister, and sister. There is no history of Breast cancer.  ROS:   Please see the history of present illness.     All other systems reviewed and are negative.  EKGs/Labs/Other Studies Reviewed:    The following studies were reviewed today:   EKG:   EKG is  ordered today.  The ekg ordered today demonstrates normal sinus rhythm, normal ECG.  Recent Labs: 11/21/2019: ALT 12; BUN 15; Creatinine, Ser 0.71; Hemoglobin 13.7; Platelets 282.0; Potassium 4.5; Sodium 139; TSH 1.05  Recent Lipid Panel    Component Value Date/Time   CHOL 169 11/21/2019 0800   TRIG 97.0 11/21/2019 0800   HDL 48.40 11/21/2019 0800   CHOLHDL 4 11/21/2019 0800   VLDL 19.4 11/21/2019 0800   LDLCALC 102 (H) 11/21/2019 0800   LDLCALC 135 (H) 04/14/2017 0822     Risk Assessment/Calculations:          Physical Exam:    VS:  BP 116/68 (BP Location: Right Arm, Patient Position: Sitting, Cuff Size: Normal)   Pulse 85   Ht 5\' 3"  (1.6 m)   Wt 118 lb 8 oz (53.8 kg)   SpO2 97%   BMI 20.99 kg/m     Wt Readings from Last 3 Encounters:  07/12/20 118 lb 8 oz (53.8 kg)  05/30/20 122 lb (55.3 kg)  11/27/19 121 lb (54.9 kg)     GEN:  Well nourished, well developed in no acute distress HEENT: Normal NECK: No JVD; No carotid bruits LYMPHATICS: No lymphadenopathy CARDIAC: RRR, no murmurs, rubs, gallops RESPIRATORY:  Clear to auscultation without rales, wheezing or rhonchi  ABDOMEN: Soft, non-tender, non-distended MUSCULOSKELETAL:  No edema; No deformity  SKIN: Warm and dry NEUROLOGIC:  Alert and oriented x 3 PSYCHIATRIC:  Normal affect   ASSESSMENT:    1. Coronary artery disease involving native coronary artery of native heart without angina pectoris   2. Pure hypercholesterolemia   3. Smoking    PLAN:    In order of problems listed above:  CAD/coronary artery calcification in the LAD noted on recent chest CT.  Denies chest pain.  Continue aspirin, Lipitor.  Obtain echocardiogram.  Patient denies chest pain.  If she becomes symptomatic, consider Myoview to evaluate ischemia. Hyperlipidemia, last lipid panel about 8 months ago.  Continue Lipitor.  Obtain fasting lipid profile.  Consider up titration of cholesterol based on lipid panel results. Current  smoker, cessation advised.  Follow-up after echo and fasting lipid profile.     Medication Adjustments/Labs and Tests Ordered: Current medicines are reviewed at length with the patient today.  Concerns regarding medicines are outlined above.  Orders Placed This Encounter  Procedures   EKG 12-Lead   ECHOCARDIOGRAM COMPLETE    No orders of the defined types were placed in this encounter.   Patient Instructions  Medication Instructions:  Your physician recommends that you continue on your current medications as directed. Please refer to the Current Medication list given to you today.  *  If you need a refill on your cardiac medications before your next appointment, please call your pharmacy*   Lab Work: None ordered If you have labs (blood work) drawn today and your tests are completely normal, you will receive your results only by: [place] (if you have MyChart) OR A paper copy in the mail If you have any lab test that is abnormal or we need to change your treatment, we will call you to review the results.   Testing/Procedures:   Your physician has requested that you have an echocardiogram. Echocardiography is a painless test that uses sound waves to create images of your heart. It provides your doctor with information about the size and shape of your heart and how well your heart's chambers and valves are working. This procedure takes approximately one hour. There are no restrictions for this procedure.    Follow-Up: At [facility], you and your health needs are our priority.  As part of our continuing mission to provide you with exceptional heart care, we have created designated Provider Care Teams.  These Care Teams include your primary Cardiologist (physician) and Advanced Practice Providers (APPs -  Physician Assistants and Nurse Practitioners) who all work together to provide you with the care you need, when you need it.  We recommend signing up for the patient  portal called "MyChart".  Sign up information is provided on this After Visit Summary.  MyChart is used to connect with patients for Virtual Visits (Telemedicine).  Patients are able to view lab/test results, encounter notes, upcoming appointments, etc.  Non-urgent messages can be sent to your provider as well.   To learn more about what you can do with MyChart, go to [email or web address].    Your next appointment:   Follow up after Echo   The format for your next appointment:   In Person  Provider:   Kate Sable, MD   Other Instructions    Signed, Brittany Sable, MD  07/12/2020 9:51 AM    [place]

## 2020-07-12 NOTE — Patient Instructions (Addendum)
Medication Instructions:  Your physician recommends that you continue on your current medications as directed. Please refer to the Current Medication list given to you today.  *If you need a refill on your cardiac medications before your next appointment, please call your pharmacy*   Lab Work:  Your physician recommends that you return for a FASTING lipid profile: when you return for your Echo  - You will need to be fasting. Please do not have anything to eat or drink after midnight the morning you have the lab work. You may only have water or black coffee with no cream or sugar.    If you have labs (blood work) drawn today and your tests are completely normal, you will receive your results only by: [place] (if you have MyChart) OR A paper copy in the mail If you have any lab test that is abnormal or we need to change your treatment, we will call you to review the results.   Testing/Procedures:   Your physician has requested that you have an echocardiogram. Echocardiography is a painless test that uses sound waves to create images of your heart. It provides your doctor with information about the size and shape of your heart and how well your heart's chambers and valves are working. This procedure takes approximately one hour. There are no restrictions for this procedure.    Follow-Up: At [facility], you and your health needs are our priority.  As part of our continuing mission to provide you with exceptional heart care, we have created designated Provider Care Teams.  These Care Teams include your primary Cardiologist (physician) and Advanced Practice Providers (APPs -  Physician Assistants and Nurse Practitioners) who all work together to provide you with the care you need, when you need it.  We recommend signing up for the patient portal called "MyChart".  Sign up information is provided on this After Visit Summary.  MyChart is used to connect with patients for Virtual  Visits (Telemedicine).  Patients are able to view lab/test results, encounter notes, upcoming appointments, etc.  Non-urgent messages can be sent to your provider as well.   To learn more about what you can do with MyChart, go to [email or web address].    Your next appointment:   Follow up after Echo   The format for your next appointment:   In Person  Provider:   Kate Sable, MD   Other Instructions

## 2020-07-12 NOTE — Addendum Note (Signed)
Addended by: Kavin Leech on: 07/12/2020 02:53 PM   Modules accepted: Orders

## 2020-08-12 ENCOUNTER — Ambulatory Visit
Admission: RE | Admit: 2020-08-12 | Discharge: 2020-08-12 | Disposition: A | Discharge status: Home / Self Care | Payer: MEDICARE | Source: Ambulatory Visit | Attending: Internal Medicine | Admitting: Internal Medicine

## 2020-08-12 ENCOUNTER — Other Ambulatory Visit: Payer: Self-pay

## 2020-08-12 DIAGNOSIS — Z1231 Encounter for screening mammogram for malignant neoplasm of breast: Secondary | ICD-10-CM | POA: Diagnosis not present

## 2020-08-19 ENCOUNTER — Ambulatory Visit (INDEPENDENT_AMBULATORY_CARE_PROVIDER_SITE_OTHER): Payer: MEDICARE

## 2020-08-19 ENCOUNTER — Other Ambulatory Visit: Payer: Self-pay

## 2020-08-19 ENCOUNTER — Other Ambulatory Visit (INDEPENDENT_AMBULATORY_CARE_PROVIDER_SITE_OTHER): Payer: MEDICARE

## 2020-08-19 DIAGNOSIS — I2510 Atherosclerotic heart disease of native coronary artery without angina pectoris: Secondary | ICD-10-CM | POA: Diagnosis not present

## 2020-08-20 LAB — LIPID PANEL
Chol/HDL Ratio: 3.4 ratio (ref 0.0–4.4)
Cholesterol, Total: 175 mg/dL (ref 100–199)
HDL: 51 mg/dL (ref >39)
LDL Chol Calc (NIH): 101 mg/dL — ABNORMAL HIGH (ref 0–99)
Triglycerides: 127 mg/dL (ref 0–149)
VLDL Cholesterol Cal: 23 mg/dL (ref 5–40)

## 2020-08-20 LAB — ECHOCARDIOGRAM COMPLETE
AR max vel: 2.18 cm2
AV Area VTI: 2.28 cm2
AV Area mean vel: 2.19 cm2
AV Mean grad: 3 mmHg
AV Peak grad: 5.4 mmHg
Ao pk vel: 1.16 m/s
Area-P 1/2: 3.68 cm2
Calc EF: 57.5 %
S' Lateral: 2.4 cm
Single Plane A2C EF: 59.1 %
Single Plane A4C EF: 54.2 %

## 2020-08-22 ENCOUNTER — Encounter: Payer: Self-pay | Admitting: Internal Medicine

## 2020-08-26 ENCOUNTER — Other Ambulatory Visit: Payer: Self-pay

## 2020-08-26 ENCOUNTER — Encounter: Payer: Self-pay | Admitting: Cardiology

## 2020-08-26 ENCOUNTER — Ambulatory Visit (INDEPENDENT_AMBULATORY_CARE_PROVIDER_SITE_OTHER): Payer: MEDICARE | Admitting: Cardiology

## 2020-08-26 VITALS — BP 114/64 | HR 70 | Ht 63.0 in | Wt 118.0 lb

## 2020-08-26 DIAGNOSIS — F17200 Nicotine dependence, unspecified, uncomplicated: Secondary | ICD-10-CM | POA: Diagnosis not present

## 2020-08-26 DIAGNOSIS — E7800 Pure hypercholesterolemia, unspecified: Secondary | ICD-10-CM | POA: Diagnosis not present

## 2020-08-26 DIAGNOSIS — I2510 Atherosclerotic heart disease of native coronary artery without angina pectoris: Secondary | ICD-10-CM

## 2020-08-26 DIAGNOSIS — E785 Hyperlipidemia, unspecified: Secondary | ICD-10-CM | POA: Diagnosis not present

## 2020-08-26 MED ORDER — ATORVASTATIN CALCIUM 80 MG PO TABS: 80.0000 mg | ORAL_TABLET | Freq: Every day | ORAL | 3 refills | Status: DC

## 2020-08-26 NOTE — Progress Notes (Signed)
Cardiology Office Note:    Date:  08/26/2020   ID:  Brittany Burch, DOB 1950/12/23, MRN [id number]  PCP:  McLean-Scocuzza, Nino Glow, MD   [facility] Providers Cardiologist:  Kate Sable, MD     Referring MD: McLean-Scocuzza, Brittany Burch *   Chief Complaint  Patient presents with   Other    Follow up post ECHO -- Meds reviewed verbally with patient.      History of Present Illness:    Brittany Burch is a 70 y.o. female with a hx of hyperlipidemia, current smoker 30+yrs who presents for follow-up.  She was last seen due to coronary artery disease/calcifications noted in the LAD on noncontrast chest CT.  Denies chest pain, echocardiogram was ordered to evaluate systolic function, fasting lipid profile also obtained.  Patient still smokes.  Presents for echo results.  Prior notes Noncontrast chest CT obtained 05/30/2020 showed LAD calcification.  Emphysema also noted.   Past Medical History:  Diagnosis Date   Allergy    cats    Cancer ([place])    cervical   Hyperlipidemia     Past Surgical History:  Procedure Laterality Date   ABDOMINAL HYSTERECTOMY     1983   COLONOSCOPY WITH PROPOFOL N/A 12/31/2017   Procedure: COLONOSCOPY WITH PROPOFOL;  Surgeon: Brittany Manifold, MD;  Location: [facility] ENDOSCOPY;  Service: Endoscopy;  Laterality: N/A;    Current Medications: Current Meds  Medication Sig   albuterol (VENTOLIN HFA) 108 (90 Base) MCG/ACT inhaler Inhale 1-2 puffs into the lungs every 4 (four) hours as needed for wheezing or shortness of breath.   aspirin EC 81 MG tablet Take 81 mg by mouth daily.   Fluticasone-Umeclidin-Vilant (TRELEGY ELLIPTA) 100-62.5-25 MCG/INH AEPB Inhale 1 puff into the lungs daily. Rinse mouth   [DISCONTINUED] atorvastatin (LIPITOR) 40 MG tablet Take 1 tablet (40 mg total) by mouth daily at 6 PM.     Allergies:   Patient has no known allergies.   Social History   Socioeconomic History   Marital status: Widowed    Spouse name: Not  on file   Number of children: Not on file   Years of education: Not on file   Highest education level: Not on file  Occupational History   Not on file  Tobacco Use   Smoking status: Every Day    Packs/day: 0.75    Years: 46.00    Pack years: 34.50    Types: Cigarettes   Smokeless tobacco: Never   Tobacco comments:    only smokes a few a day now  Vaping Use   Vaping Use: Never used  Substance and Sexual Activity   Alcohol use: No   Drug use: No   Sexual activity: Never  Other Topics Concern   Not on file  Social History Narrative   Widowed    12 grade education    Social Determinants of Health   Financial Resource Strain: Low Risk    Difficulty of Paying Living Expenses: Not hard at all  Food Insecurity: No Food Insecurity   Worried About Charity fundraiser in the Last Year: Never true   Ran Out of Food in the Last Year: Never true  Transportation Needs: No Transportation Needs   Lack of Transportation (Medical): No   Lack of Transportation (Non-Medical): No  Physical Activity: Not on file  Stress: No Stress Concern Present   Feeling of Stress : Not at all  Social Connections: Unknown   Frequency of Communication with Friends  and Family: More than three times a week   Frequency of Social Gatherings with Friends and Family: More than three times a week   Attends Religious Services: Not on Electrical engineer or Organizations: Not on file   Attends Archivist Meetings: Not on file   Marital Status: Not on file     Family History: The patient's family history includes Cancer in her mother; Muscular dystrophy in her father, sister, and sister. There is no history of Breast cancer.  ROS:   Please see the history of present illness.     All other systems reviewed and are negative.  EKGs/Labs/Other Studies Reviewed:    The following studies were reviewed today:   EKG:  EKG not ordered today.    Recent Labs: 11/21/2019: ALT 12; BUN 15;  Creatinine, Ser 0.71; Hemoglobin 13.7; Platelets 282.0; Potassium 4.5; Sodium 139; TSH 1.05  Recent Lipid Panel    Component Value Date/Time   CHOL 175 08/19/2020 0817   TRIG 127 08/19/2020 0817   HDL 51 08/19/2020 0817   CHOLHDL 3.4 08/19/2020 0817   CHOLHDL 4 11/21/2019 0800   VLDL 19.4 11/21/2019 0800   LDLCALC 101 (H) 08/19/2020 0817   LDLCALC 135 (H) 04/14/2017 0822     Risk Assessment/Calculations:          Physical Exam:    VS:  BP 114/64 (BP Location: Left Arm, Patient Position: Sitting, Cuff Size: Normal)   Pulse 70   Ht '5\' 3"'$  (1.6 m)   Wt 118 lb (53.5 kg)   SpO2 99%   BMI 20.90 kg/m     Wt Readings from Last 3 Encounters:  08/26/20 118 lb (53.5 kg)  07/12/20 118 lb 8 oz (53.8 kg)  05/30/20 122 lb (55.3 kg)     GEN:  Well nourished, well developed in no acute distress HEENT: Normal NECK: No JVD; No carotid bruits LYMPHATICS: No lymphadenopathy CARDIAC: RRR, no murmurs, rubs, gallops RESPIRATORY:  Clear to auscultation without rales, wheezing or rhonchi  ABDOMEN: Soft, non-tender, non-distended MUSCULOSKELETAL:  No edema; No deformity  SKIN: Warm and dry NEUROLOGIC:  Alert and oriented x 3 PSYCHIATRIC:  Normal affect   ASSESSMENT:    1. Coronary artery disease involving native coronary artery of native heart without angina pectoris   2. Pure hypercholesterolemia   3. Smoking   4. Hyperlipidemia, unspecified hyperlipidemia type     PLAN:    In order of problems listed above:  CAD/coronary artery calcification in the LAD noted on chest CT.  Denies chest pain.  Continue aspirin, increase Lipitor to 80 mg daily.  Echo 08/2020 shows normal systolic and diastolic function, EF 60 to 65%. Hyperlipidemia, LDL not at goal, LAD calcifications, current smoker.  Increase Lipitor to 80 mg daily. Current smoker, cessation advised.  Follow-up in 6 months after repeat fasting lipid profile.     Medication Adjustments/Labs and Tests Ordered: Current medicines  are reviewed at length with the patient today.  Concerns regarding medicines are outlined above.  Orders Placed This Encounter  Procedures   Lipid panel     Meds ordered this encounter  Medications   atorvastatin (LIPITOR) 80 MG tablet    Sig: Take 1 tablet (80 mg total) by mouth daily at 6 PM.    Dispense:  90 tablet    Refill:  3    Generic ok note increased dose     Patient Instructions  Medication Instructions:   Your physician has recommended  you make the following change in your medication:   INCREASE your Lipitor to 80 MG once a day.    *If you need a refill on your cardiac medications before your next appointment, please call your pharmacy*   Lab Work:  Your physician recommends that you return for a FASTING lipid profile: IN 6 MONTHS  - You will need to be fasting. Please do not have anything to eat or drink after midnight the morning you have the lab work. You may only have water or black coffee with no cream or sugar.  - Please go to the [facility]. You will check in at the front desk to the right as you walk into the atrium. Valet Parking is offered if needed. - No appointment needed. You may go any day between 7 am and 6 pm.     Testing/Procedures: None ordered   Follow-Up: At [facility], you and your health needs are our priority.  As part of our continuing mission to provide you with exceptional heart care, we have created designated Provider Care Teams.  These Care Teams include your primary Cardiologist (physician) and Advanced Practice Providers (APPs -  Physician Assistants and Nurse Practitioners) who all work together to provide you with the care you need, when you need it.  We recommend signing up for the patient portal called "MyChart".  Sign up information is provided on this After Visit Summary.  MyChart is used to connect with patients for Virtual Visits (Telemedicine).  Patients are able to view lab/test results, encounter notes,  upcoming appointments, etc.  Non-urgent messages can be sent to your provider as well.   To learn more about what you can do with MyChart, go to [email or web address].    Your next appointment:   6 month(s)  The format for your next appointment:   In Person  Provider:   You may see Kate Sable, MD or one of the following Advanced Practice Providers on your designated Care Team:   Murray Hodgkins, NP Christell Faith, PA-C Marrianne Mood, PA-C Cadence [place], [place]   Other Instructions    Signed, Kate Sable, MD  08/26/2020 11:44 AM    [place]

## 2020-08-26 NOTE — Patient Instructions (Signed)
Medication Instructions:   Your physician has recommended you make the following change in your medication:   INCREASE your Lipitor to 80 MG once a day.    *If you need a refill on your cardiac medications before your next appointment, please call your pharmacy*   Lab Work:  Your physician recommends that you return for a FASTING lipid profile: IN 6 MONTHS  - You will need to be fasting. Please do not have anything to eat or drink after midnight the morning you have the lab work. You may only have water or black coffee with no cream or sugar.  - Please go to the [facility]. You will check in at the front desk to the right as you walk into the atrium. Valet Parking is offered if needed. - No appointment needed. You may go any day between 7 am and 6 pm.     Testing/Procedures: None ordered   Follow-Up: At [facility], you and your health needs are our priority.  As part of our continuing mission to provide you with exceptional heart care, we have created designated Provider Care Teams.  These Care Teams include your primary Cardiologist (physician) and Advanced Practice Providers (APPs -  Physician Assistants and Nurse Practitioners) who all work together to provide you with the care you need, when you need it.  We recommend signing up for the patient portal called "MyChart".  Sign up information is provided on this After Visit Summary.  MyChart is used to connect with patients for Virtual Visits (Telemedicine).  Patients are able to view lab/test results, encounter notes, upcoming appointments, etc.  Non-urgent messages can be sent to your provider as well.   To learn more about what you can do with MyChart, go to [email or web address].    Your next appointment:   6 month(s)  The format for your next appointment:   In Person  Provider:   You may see Kate Sable, MD or one of the following Advanced Practice Providers on your designated Care Team:   Murray Hodgkins, NP Christell Faith, PA-C Marrianne Mood, PA-C Cadence Kathlen [place], [place]   Other Instructions

## 2020-08-27 ENCOUNTER — Ambulatory Visit: Payer: MEDICARE | Admitting: Internal Medicine

## 2020-10-22 ENCOUNTER — Ambulatory Visit: Payer: MEDICARE | Admitting: Internal Medicine

## 2020-10-29 ENCOUNTER — Ambulatory Visit (INDEPENDENT_AMBULATORY_CARE_PROVIDER_SITE_OTHER): Payer: MEDICARE

## 2020-10-29 ENCOUNTER — Other Ambulatory Visit: Payer: Self-pay

## 2020-10-29 DIAGNOSIS — Z23 Encounter for immunization: Secondary | ICD-10-CM | POA: Diagnosis not present

## 2020-11-14 DIAGNOSIS — Z23 Encounter for immunization: Secondary | ICD-10-CM | POA: Diagnosis not present

## 2020-11-19 ENCOUNTER — Ambulatory Visit: Payer: MEDICARE | Admitting: Internal Medicine

## 2020-11-27 ENCOUNTER — Ambulatory Visit: Payer: MEDICARE

## 2021-01-01 ENCOUNTER — Other Ambulatory Visit: Payer: Self-pay

## 2021-01-01 ENCOUNTER — Ambulatory Visit (INDEPENDENT_AMBULATORY_CARE_PROVIDER_SITE_OTHER): Payer: MEDICARE | Admitting: Internal Medicine

## 2021-01-01 ENCOUNTER — Encounter: Payer: Self-pay | Admitting: Internal Medicine

## 2021-01-01 VITALS — BP 118/76 | HR 52 | Temp 96.8°F | Ht 62.84 in | Wt 116.2 lb

## 2021-01-01 DIAGNOSIS — E559 Vitamin D deficiency, unspecified: Secondary | ICD-10-CM | POA: Diagnosis not present

## 2021-01-01 DIAGNOSIS — J449 Chronic obstructive pulmonary disease, unspecified: Secondary | ICD-10-CM

## 2021-01-01 DIAGNOSIS — Z1329 Encounter for screening for other suspected endocrine disorder: Secondary | ICD-10-CM | POA: Diagnosis not present

## 2021-01-01 DIAGNOSIS — E785 Hyperlipidemia, unspecified: Secondary | ICD-10-CM | POA: Diagnosis not present

## 2021-01-01 DIAGNOSIS — I2510 Atherosclerotic heart disease of native coronary artery without angina pectoris: Secondary | ICD-10-CM

## 2021-01-01 DIAGNOSIS — Z122 Encounter for screening for malignant neoplasm of respiratory organs: Secondary | ICD-10-CM | POA: Diagnosis not present

## 2021-01-01 DIAGNOSIS — Z1389 Encounter for screening for other disorder: Secondary | ICD-10-CM | POA: Diagnosis not present

## 2021-01-01 DIAGNOSIS — Z1231 Encounter for screening mammogram for malignant neoplasm of breast: Secondary | ICD-10-CM | POA: Diagnosis not present

## 2021-01-01 DIAGNOSIS — I700 Atherosclerosis of aorta: Secondary | ICD-10-CM | POA: Diagnosis not present

## 2021-01-01 LAB — CBC WITH DIFFERENTIAL/PLATELET
Basophils Absolute: 0.1 10*3/uL (ref 0.0–0.1)
Basophils Relative: 0.8 % (ref 0.0–3.0)
Eosinophils Absolute: 0.1 10*3/uL (ref 0.0–0.7)
Eosinophils Relative: 1.1 % (ref 0.0–5.0)
HCT: 40.5 % (ref 36.0–46.0)
Hemoglobin: 13.5 g/dL (ref 12.0–15.0)
Lymphocytes Relative: 34.6 % (ref 12.0–46.0)
Lymphs Abs: 3 10*3/uL (ref 0.7–4.0)
MCHC: 33.2 g/dL (ref 30.0–36.0)
MCV: 94 fl (ref 78.0–100.0)
Monocytes Absolute: 0.6 10*3/uL (ref 0.1–1.0)
Monocytes Relative: 7 % (ref 3.0–12.0)
Neutro Abs: 4.9 10*3/uL (ref 1.4–7.7)
Neutrophils Relative %: 56.5 % (ref 43.0–77.0)
Platelets: 293 10*3/uL (ref 150.0–400.0)
RBC: 4.31 Mil/uL (ref 3.87–5.11)
RDW: 13.6 % (ref 11.5–15.5)
WBC: 8.7 10*3/uL (ref 4.0–10.5)

## 2021-01-01 LAB — TSH: TSH: 1.12 u[IU]/mL (ref 0.35–5.50)

## 2021-01-01 LAB — COMPREHENSIVE METABOLIC PANEL
ALT: 15 U/L (ref 0–35)
AST: 20 U/L (ref 0–37)
Albumin: 4.3 g/dL (ref 3.5–5.2)
Alkaline Phosphatase: 83 U/L (ref 39–117)
BUN: 19 mg/dL (ref 6–23)
CO2: 30 mEq/L (ref 19–32)
Calcium: 10 mg/dL (ref 8.4–10.5)
Chloride: 104 mEq/L (ref 96–112)
Creatinine, Ser: 0.69 mg/dL (ref 0.40–1.20)
GFR: 87.07 mL/min (ref >60.00)
Glucose, Bld: 84 mg/dL (ref 70–99)
Potassium: 4.5 mEq/L (ref 3.5–5.1)
Sodium: 140 mEq/L (ref 135–145)
Total Bilirubin: 0.3 mg/dL (ref 0.2–1.2)
Total Protein: 7.6 g/dL (ref 6.0–8.3)

## 2021-01-01 LAB — VITAMIN D 25 HYDROXY (VIT D DEFICIENCY, FRACTURES): VITD: 64.95 ng/mL (ref 30.00–100.00)

## 2021-01-01 MED ORDER — TRELEGY ELLIPTA 100-62.5-25 MCG/ACT IN AEPB: 1.0000 | INHALATION_SPRAY | Freq: Every day | RESPIRATORY_TRACT | 5 refills | Status: DC

## 2021-01-01 MED ORDER — ALBUTEROL SULFATE HFA 108 (90 BASE) MCG/ACT IN AERS: 1.0000 | INHALATION_SPRAY | RESPIRATORY_TRACT | 3 refills | Status: DC | PRN

## 2021-01-01 NOTE — Progress Notes (Signed)
Chief Complaint  Patient presents with   Annual Exam   Annual  1. Hld lipitor 80 mg qhs  2. Copd on albuterol, trelegy stable    Review of Systems  Constitutional:  Negative for weight loss.  HENT:  Negative for hearing loss.   Eyes:  Negative for blurred vision.  Respiratory:  Negative for shortness of breath.   Cardiovascular:  Negative for chest pain.  Gastrointestinal:  Negative for abdominal pain and blood in stool.  Genitourinary:  Negative for dysuria.  Musculoskeletal:  Negative for falls and joint pain.  Skin:  Negative for rash.  Neurological:  Negative for headaches.  Psychiatric/Behavioral:  Negative for depression.   Past Medical History:  Diagnosis Date   Allergy    cats    Cancer ([place])    cervical   Hyperlipidemia    Past Surgical History:  Procedure Laterality Date   ABDOMINAL HYSTERECTOMY     1983   COLONOSCOPY WITH PROPOFOL N/A 12/31/2017   Procedure: COLONOSCOPY WITH PROPOFOL;  Surgeon: Virgel Manifold, MD;  Location: [facility] ENDOSCOPY;  Service: Endoscopy;  Laterality: N/A;   Family History  Problem Relation Age of Onset   Cancer Mother        lung cancer smoker died 23   Muscular dystrophy Father    Muscular dystrophy Sister    Muscular dystrophy Sister    Breast cancer Neg Hx    Social History   Socioeconomic History   Marital status: Widowed    Spouse name: Not on file   Number of children: Not on file   Years of education: Not on file   Highest education level: Not on file  Occupational History   Not on file  Tobacco Use   Smoking status: Every Day    Packs/day: 0.75    Years: 46.00    Pack years: 34.50    Types: Cigarettes   Smokeless tobacco: Never   Tobacco comments:    only smokes a few a day now  Vaping Use   Vaping Use: Never used  Substance and Sexual Activity   Alcohol use: No   Drug use: No   Sexual activity: Never  Other Topics Concern   Not on file  Social History Narrative   Widowed    12 grade education     Social Determinants of Health   Financial Resource Strain: Not on file  Food Insecurity: Not on file  Transportation Needs: Not on file  Physical Activity: Not on file  Stress: Not on file  Social Connections: Not on file  Intimate Partner Violence: Not on file   Current Meds  Medication Sig   aspirin EC 81 MG tablet Take 81 mg by mouth daily.   atorvastatin (LIPITOR) 80 MG tablet Take 1 tablet (80 mg total) by mouth daily at 6 PM.   Fluticasone-Umeclidin-Vilant (TRELEGY ELLIPTA) 100-62.5-25 MCG/ACT AEPB Inhale 1 puff into the lungs daily.   No Known Allergies No results found for this or any previous visit (from the past 2160 hour(s)). Objective  Body mass index is 20.69 kg/m. Wt Readings from Last 3 Encounters:  01/01/21 116 lb 3.2 oz (52.7 kg)  08/26/20 118 lb (53.5 kg)  07/12/20 118 lb 8 oz (53.8 kg)   Temp Readings from Last 3 Encounters:  01/01/21 (!) 96.8 F (36 C) (Temporal)  08/25/19 98 F (36.7 C) (Oral)  11/18/18 97.7 F (36.5 C) (Oral)   BP Readings from Last 3 Encounters:  01/01/21 118/76  08/26/20 114/64  07/12/20 116/68   Pulse Readings from Last 3 Encounters:  01/01/21 (!) 52  08/26/20 70  07/12/20 85    Physical Exam Vitals and nursing note reviewed.  Constitutional:      Appearance: Normal appearance. She is well-developed and well-groomed.  HENT:     Head: Normocephalic and atraumatic.  Eyes:     Conjunctiva/sclera: Conjunctivae normal.     Pupils: Pupils are equal, round, and reactive to light.  Cardiovascular:     Rate and Rhythm: Normal rate and regular rhythm.     Heart sounds: Normal heart sounds. No murmur heard. Pulmonary:     Effort: Pulmonary effort is normal.     Breath sounds: Normal breath sounds.  Abdominal:     General: Abdomen is flat. Bowel sounds are normal.     Tenderness: There is no abdominal tenderness.  Musculoskeletal:        General: No tenderness.  Skin:    General: Skin is warm and dry.  Neurological:      General: No focal deficit present.     Mental Status: She is alert and oriented to person, place, and time. Mental status is at baseline.     Cranial Nerves: Cranial nerves 2-12 are intact.     Gait: Gait is intact.  Psychiatric:        Attention and Perception: Attention and perception normal.        Mood and Affect: Mood and affect normal.        Speech: Speech normal.        Behavior: Behavior normal. Behavior is cooperative.        Thought Content: Thought content normal.        Cognition and Memory: Cognition and memory normal.        Judgment: Judgment normal.    Assessment  Plan  Chronic obstructive pulmonary disease, stable- Plan: albuterol (VENTOLIN HFA) 108 (90 Base) MCG/ACT inhaler, Fluticasone-Umeclidin-Vilant (TRELEGY ELLIPTA) 100-62.5-25 MCG/ACT AEPB  Hyperlipidemia, unspecified hyperlipidemia type - Plan: Comprehensive metabolic panel, CBC with Differential/Platelet On lipitor 80 mg qd  Lipid panel had 08/2020  Coronary artery disease involving native coronary artery of native heart without angina pectoris Aortic atherosclerosis (HCC)  On aspirin 81 and lipitor 80 mg qd  F/u cards   HM Had flu shot utd  Had pna 12 09/04/17  Tdap 05/17/18 prevnar may have had 06/22/16  zostervax 01/23/09 disc shingrix had 2/2  covid 4/4 last 2 had 04/08/19 and 11/14/20    Declines MMR check Consider hep B vaccine Hep C neg    mammo neg 08/12/20 Ordered   Pap s/p hysterectomy had 1 nl years ago with ho abnormal; pap s/p hysterectomy nl per pt   Colonoscopy  12/31/17 normal Dr. Darene Lamer f/u in 10 years  No FH  DEXA 04/2012 normal ? If others with Dr. Lavera Guise. DEXA 10/12/17 normal  No further repeats on DEXA   +smoker rec cessation smoking 20-30 years h/o lung cancer in mother max 4 pks/week now 1-2 pks/week.  -CT scan due 05/24/18 h/o lung nodule and copd noted in smoker repeat in 1 year rec cessation    Provider: Dr. Olivia Mackie McLean-Scocuzza-Internal Medicine

## 2021-01-01 NOTE — Patient Instructions (Signed)
D3 4000 Iu daily

## 2021-01-02 LAB — URINALYSIS, ROUTINE W REFLEX MICROSCOPIC
Bilirubin Urine: NEGATIVE
Glucose, UA: NEGATIVE
Hgb urine dipstick: NEGATIVE
Ketones, ur: NEGATIVE
Leukocytes,Ua: NEGATIVE
Nitrite: NEGATIVE
Protein, ur: NEGATIVE
Specific Gravity, Urine: 1.025 (ref 1.001–1.035)
pH: 5.5 (ref 5.0–8.0)

## 2021-01-08 ENCOUNTER — Telehealth: Payer: Self-pay | Admitting: Internal Medicine

## 2021-01-08 NOTE — Telephone Encounter (Signed)
Patient called in because she received medicine from the pharmacy and it was 6 inhalers 3 albuterol inhalers , 3 Trelegy inhalers  and patient feels like she do not need two different inhalers, but if needed patient would like to know why.The patient owes 102.00 patient advised never owe  anything before

## 2021-01-09 ENCOUNTER — Other Ambulatory Visit: Payer: Self-pay

## 2021-01-09 NOTE — Telephone Encounter (Signed)
Pt is calling in regards to prev messages. Pt just wants to be sure the albuterol inhaler is taken off pts med list. Pt receives meds through home delivery service. Pt wants to sure she is not sent the albuterol inhalers again because she now owes $102 for three boxes she received and can't return.

## 2021-01-09 NOTE — Telephone Encounter (Signed)
Patient called and said she does not take albuterol (VENTOLIN HFA) 108 (90 Base) MCG/ACT inhaler, Dr Aundra Dubin changed her to Fluticasone-Umeclidin-Vilant (TRELEGY ELLIPTA) 100-62.5-25 MCG/ACT AEPB.

## 2021-01-09 NOTE — Telephone Encounter (Signed)
Please advise, both inhalers sent in by you. Albuterol inhaler has fallen off the Patient's med list but was sent in 01/02/21

## 2021-01-09 NOTE — Telephone Encounter (Signed)
Trelegy is maintenance daily rinse mouth and albuterol is rescue inhaler if breathing flares wheezing cough sob  To have just in case can try good rx if albuterol needed in the future

## 2021-01-10 NOTE — Telephone Encounter (Signed)
Patien twas notified concerning the difference in the inhalers and there usage now she says he understands and will continue to use th ealbuterol when needed and the trellegy daily.

## 2021-01-15 ENCOUNTER — Ambulatory Visit (INDEPENDENT_AMBULATORY_CARE_PROVIDER_SITE_OTHER): Payer: MEDICARE

## 2021-01-15 VITALS — Ht 62.84 in | Wt 116.0 lb

## 2021-01-15 DIAGNOSIS — Z0000 Encounter for general adult medical examination without abnormal findings: Secondary | ICD-10-CM

## 2021-01-15 NOTE — Patient Instructions (Addendum)
°  Brittany Burch , Thank you for taking time to come for your Medicare Wellness Visit. I appreciate your ongoing commitment to your health goals. Please review the following plan we discussed and let me know if I can assist you in the future.   These are the goals we discussed:  Goals      Maintain Healthy Lifestyle     Stay active Healthy diet        This is a list of the screening recommended for you and due dates:  Health Maintenance  Topic Date Due   COVID-19 Vaccine (3 - Moderna risk series) 11/14/2020   Mammogram  08/13/2022   Colon Cancer Screening  01/01/2028   Tetanus Vaccine  05/16/2028   Pneumonia Vaccine  Completed   Flu Shot  Completed   DEXA scan (bone density measurement)  Completed   Hepatitis C Screening: USPSTF Recommendation to screen - Ages 71-79 yo.  Completed   Zoster (Shingles) Vaccine  Completed   HPV Vaccine  Aged Out

## 2021-01-15 NOTE — Progress Notes (Signed)
Subjective:   Brittany Burch is a 71 y.o. female who presents for Medicare Annual (Subsequent) preventive examination.  Review of Systems    No ROS.  Medicare Wellness Virtual Visit.  Visual/audio telehealth visit, UTA vital signs.   See social history for additional risk factors.   Cardiac Risk Factors include: advanced age (>55men, >72 women)     Objective:    Today's Vitals   01/15/21 0825  Weight: 116 lb (52.6 kg)  Height: 5' 2.84" (1.596 m)   Body mass index is 20.65 kg/m.  Advanced Directives 11/27/2019 11/24/2018 12/31/2017 11/17/2017  Does Patient Have a Medical Advance Directive? Yes Yes Yes No  Type of Advance Directive Healthcare Power of Attorney Living will Living will -  Does patient want to make changes to medical advance directive? No - Patient declined - - -  Copy of [place] in Chart? No - copy requested - - -  Would patient like information on creating a medical advance directive? - - - Yes (MAU/Ambulatory/Procedural Areas - Information given)    Current Medications (verified) Outpatient Encounter Medications as of 01/15/2021  Medication Sig   albuterol (VENTOLIN HFA) 108 (90 Base) MCG/ACT inhaler Inhale 1-2 puffs into the lungs every 4 (four) hours as needed for wheezing or shortness of breath.   aspirin EC 81 MG tablet Take 81 mg by mouth daily.   atorvastatin (LIPITOR) 80 MG tablet Take 1 tablet (80 mg total) by mouth daily at 6 PM.   Fluticasone-Umeclidin-Vilant (TRELEGY ELLIPTA) 100-62.5-25 MCG/ACT AEPB Inhale 1 puff into the lungs daily.   No facility-administered encounter medications on file as of 01/15/2021.    Allergies (verified) Patient has no known allergies.   History: Past Medical History:  Diagnosis Date   Allergy    cats    Cancer ([place])    cervical   Hyperlipidemia    Past Surgical History:  Procedure Laterality Date   ABDOMINAL HYSTERECTOMY     1983   COLONOSCOPY WITH PROPOFOL N/A 12/31/2017    Procedure: COLONOSCOPY WITH PROPOFOL;  Surgeon: Virgel Manifold, MD;  Location: [facility] ENDOSCOPY;  Service: Endoscopy;  Laterality: N/A;   Family History  Problem Relation Age of Onset   Cancer Mother        lung cancer smoker died 67   Muscular dystrophy Father    Muscular dystrophy Sister    Muscular dystrophy Sister    Breast cancer Neg Hx    Social History   Socioeconomic History   Marital status: Widowed    Spouse name: Not on file   Number of children: Not on file   Years of education: Not on file   Highest education level: Not on file  Occupational History   Not on file  Tobacco Use   Smoking status: Every Day    Packs/day: 0.75    Years: 46.00    Pack years: 34.50    Types: Cigarettes   Smokeless tobacco: Never   Tobacco comments:    only smokes a few a day now  Vaping Use   Vaping Use: Never used  Substance and Sexual Activity   Alcohol use: No   Drug use: No   Sexual activity: Never  Other Topics Concern   Not on file  Social History Narrative   Widowed    12 grade education    Social Determinants of Health   Financial Resource Strain: Low Risk    Difficulty of Paying Living Expenses: Not hard at all  Food Insecurity: No Food Insecurity   Worried About Charity fundraiser in the Last Year: Never true   Ran Out of Food in the Last Year: Never true  Transportation Needs: No Transportation Needs   Lack of Transportation (Medical): No   Lack of Transportation (Non-Medical): No  Physical Activity: Insufficiently Active   Days of Exercise per Week: 4 days   Minutes of Exercise per Session: 30 min  Stress: No Stress Concern Present   Feeling of Stress : Not at all  Social Connections: Unknown   Frequency of Communication with Friends and Family: More than three times a week   Frequency of Social Gatherings with Friends and Family: More than three times a week   Attends Religious Services: Not on Electrical engineer or Organizations: Not on  file   Attends Archivist Meetings: Not on file   Marital Status: Not on file    Tobacco Counseling Ready to quit: Not Answered Counseling given: Not Answered Tobacco comments: only smokes a few a day now   Clinical Intake:  Pre-visit preparation completed: Yes        Diabetes: No  How often do you need to have someone help you when you read instructions, pamphlets, or other written materials from your doctor or pharmacy?: 1 - Never   Interpreter Needed?: No      Activities of Daily Living In your present state of health, do you have any difficulty performing the following activities: 01/15/2021  Hearing? N  Vision? N  Difficulty concentrating or making decisions? N  Walking or climbing stairs? N  Dressing or bathing? N  Doing errands, shopping? N  Preparing Food and eating ? N  Using the Toilet? N  In the past six months, have you accidently leaked urine? N  Do you have problems with loss of bowel control? N  Managing your Medications? N  Managing your Finances? N  Housekeeping or managing your Housekeeping? N  Some recent data might be hidden    Patient Care Team: McLean-Scocuzza, Nino Glow, MD as PCP - General (Internal Medicine) Kate Sable, MD as PCP - Cardiology (Cardiology)  Indicate any recent Medical Services you may have received from other than [facility] providers in the past year (date may be approximate).     Assessment:   This is a routine wellness examination for Brittany Burch.  Virtual Visit via Telephone Note  I connected with  Brittany Burch on 01/15/21 at  8:15 AM EST by telephone and verified that I am speaking with the correct person using two identifiers.  Persons participating in the virtual visit: patient/Nurse Health Advisor   I discussed the limitations, risks, security and privacy concerns of performing an evaluation and management service by telephone and the availability of in person appointments. The patient expressed  understanding and agreed to proceed.  Interactive audio and video telecommunications were attempted between this nurse and patient, however failed, due to patient having technical difficulties OR patient did not have access to video capability.  We continued and completed visit with audio only.  Some vital signs may be absent or patient reported.   Hearing/Vision screen Hearing Screening - Comments:: Patient is able to hear conversational tones without difficulty.  No issues reported. Vision Screening - Comments:: Wears corrective lenses   Dietary issues and exercise activities discussed: Current Exercise Habits: Home exercise routine, Type of exercise: walking, Time (Minutes): 30, Frequency (Times/Week): 4, Weekly Exercise (Minutes/Week): 120, Intensity: Mild Regular  diet  Good water intake   Goals Addressed             This Visit's Progress    Maintain Healthy Lifestyle   On track    Stay active Healthy diet       Depression Screen PHQ 2/9 Scores 01/15/2021 01/01/2021 11/27/2019 08/25/2019 11/24/2018 11/18/2018 11/17/2017  PHQ - 2 Score 0 3 0 0 0 0 0    Fall Risk Fall Risk  01/15/2021 01/01/2021 11/27/2019 08/25/2019 11/24/2018  Falls in the past year? 0 0 0 0 0  Number falls in past yr: 0 0 0 0 -  Injury with Fall? - 0 - 0 -  Risk for fall due to : - No Fall Risks - - -  Follow up Falls evaluation completed Falls evaluation completed Falls evaluation completed Falls evaluation completed -    FALL RISK PREVENTION PERTAINING TO THE HOME: Home free of loose throw rugs in walkways, pet beds, electrical cords, etc? Yes  Adequate lighting in your home to reduce risk of falls? Yes   ASSISTIVE DEVICES UTILIZED TO PREVENT FALLS: Life alert? No  Use of a cane, walker or w/c? No   TIMED UP AND GO: Was the test performed? No .   Cognitive Function: Patient is alert and oriented x3.  MMSE - Mini Mental State Exam 11/17/2017  Orientation to time 5  Orientation to Place 5   Registration 3  Attention/ Calculation 5  Recall 3  Language- name 2 objects 2  Language- repeat 1  Language- follow 3 step command 3  Language- read & follow direction 1  Write a sentence 1  Copy design 1  Total score 30     6CIT Screen 11/27/2019 11/24/2018  What Year? 0 points 0 points  What month? 0 points 0 points  What time? 0 points 0 points  Count back from 20 0 points 0 points  Months in reverse 0 points 0 points  Repeat phrase 0 points 0 points  Total Score 0 0    Immunizations Immunization History  Administered Date(s) Administered   Fluad Quad(high Dose 65+) 12/15/2019, 10/29/2020   Influenza, High Dose Seasonal PF 09/24/2017   Influenza-Unspecified 10/17/2018   Moderna Covid-19 Vaccine Bivalent Booster 32yrs & up 11/14/2020   Moderna SARS-COV2 Booster Vaccination 11/03/2019   Moderna Sars-Covid-2 Vaccination 03/11/2019, 04/08/2019   Pneumococcal Conjugate-13 06/22/2016   Pneumococcal Polysaccharide-23 09/04/2017   Tdap 05/17/2018   Zoster Recombinat (Shingrix) 11/23/2017, 01/31/2018   Screening Tests Health Maintenance  Topic Date Due   COVID-19 Vaccine (3 - Moderna risk series) 11/14/2020   MAMMOGRAM  08/13/2022   COLONOSCOPY (Pts 45-8yrs Insurance coverage will need to be confirmed)  01/01/2028   TETANUS/TDAP  05/16/2028   Pneumonia Vaccine 33+ Years old  Completed   INFLUENZA VACCINE  Completed   DEXA SCAN  Completed   Hepatitis C Screening  Completed   Zoster Vaccines- Shingrix  Completed   HPV VACCINES  Aged Out   Health Maintenance Health Maintenance Due  Topic Date Due   COVID-19 Vaccine (3 - Moderna risk series) 11/14/2020   Lung Cancer Screening: completed 05/2020  Vision Screening: Recommended annual ophthalmology exams for early detection of glaucoma and other disorders of the eye.  Dental Screening: Recommended annual dental exams for proper oral hygiene  Community Resource Referral / Chronic Care Management: CRR required this  visit?  No   CCM required this visit?  No      Plan:   Keep all routine  maintenance appointments.   I have personally reviewed and noted the following in the patients chart:   Medical and social history Use of alcohol, tobacco or illicit drugs  Current medications and supplements including opioid prescriptions. Not taking opioid.  Functional ability and status Nutritional status Physical activity Advanced directives List of other physicians Hospitalizations, surgeries, and ER visits in previous 12 months Vitals Screenings to include cognitive, depression, and falls Referrals and appointments  In addition, I have reviewed and discussed with patient certain preventive protocols, quality metrics, and best practice recommendations. A written personalized care plan for preventive services as well as general preventive health recommendations were provided to patient.     Varney Biles, LPN   07/19/3734

## 2021-02-27 ENCOUNTER — Ambulatory Visit (INDEPENDENT_AMBULATORY_CARE_PROVIDER_SITE_OTHER): Payer: MEDICARE | Admitting: Cardiology

## 2021-02-27 ENCOUNTER — Other Ambulatory Visit
Admission: RE | Admit: 2021-02-27 | Discharge: 2021-02-27 | Disposition: A | Discharge status: Home / Self Care | Payer: MEDICARE | Source: Ambulatory Visit | Attending: Cardiology | Admitting: Cardiology

## 2021-02-27 ENCOUNTER — Encounter: Payer: Self-pay | Admitting: Cardiology

## 2021-02-27 ENCOUNTER — Other Ambulatory Visit: Payer: Self-pay

## 2021-02-27 VITALS — BP 90/60 | HR 80 | Ht 63.0 in | Wt 118.0 lb

## 2021-02-27 DIAGNOSIS — E7800 Pure hypercholesterolemia, unspecified: Secondary | ICD-10-CM | POA: Diagnosis not present

## 2021-02-27 DIAGNOSIS — F17200 Nicotine dependence, unspecified, uncomplicated: Secondary | ICD-10-CM

## 2021-02-27 DIAGNOSIS — I2510 Atherosclerotic heart disease of native coronary artery without angina pectoris: Secondary | ICD-10-CM | POA: Diagnosis not present

## 2021-02-27 LAB — LIPID PANEL
Cholesterol: 185 mg/dL (ref 0–200)
HDL: 56 mg/dL (ref >40)
LDL Cholesterol: 107 mg/dL — ABNORMAL HIGH (ref 0–99)
Total CHOL/HDL Ratio: 3.3 RATIO
Triglycerides: 111 mg/dL (ref <150)
VLDL: 22 mg/dL (ref 0–40)

## 2021-02-27 NOTE — Patient Instructions (Signed)
Medication Instructions:  Your physician recommends that you continue on your current medications as directed. Please refer to the Current Medication list given to you today.  *If you need a refill on your cardiac medications before your next appointment, please call your pharmacy*   Lab Work:  Your physician recommends that you return for a FASTING lipid profile: Today  Please go to the [facility] for this lab after your office visit.    If you have labs (blood work) drawn today and your tests are completely normal, you will receive your results only by: [place] (if you have MyChart) OR A paper copy in the mail If you have any lab test that is abnormal or we need to change your treatment, we will call you to review the results.   Testing/Procedures: None ordered   Follow-Up: At [facility], you and your health needs are our priority.  As part of our continuing mission to provide you with exceptional heart care, we have created designated Provider Care Teams.  These Care Teams include your primary Cardiologist (physician) and Advanced Practice Providers (APPs -  Physician Assistants and Nurse Practitioners) who all work together to provide you with the care you need, when you need it.  We recommend signing up for the patient portal called "MyChart".  Sign up information is provided on this After Visit Summary.  MyChart is used to connect with patients for Virtual Visits (Telemedicine).  Patients are able to view lab/test results, encounter notes, upcoming appointments, etc.  Non-urgent messages can be sent to your provider as well.   To learn more about what you can do with MyChart, go to [email or web address].    Your next appointment:   1 year(s)  The format for your next appointment:   In Person  Provider:   You may see Kate Sable, MD or one of the following Advanced Practice Providers on your designated Care Team:   Murray Hodgkins, NP Christell Faith,  PA-C Cadence Kathlen Mody, [place]    Other Instructions

## 2021-02-27 NOTE — Progress Notes (Signed)
Cardiology Office Note:    Date:  02/27/2021   ID:  Brittany Burch, DOB 1950/09/22, MRN [id number]  PCP:  Burch, Brittany Glow, MD   [facility] Providers Cardiologist:  Brittany Sable, MD     Referring MD: Burch, Brittany Mackie *   Chief Complaint  Patient presents with   OTher    6 month follow up -- Meds reviewed verbally with patient.      History of Present Illness:    Brittany Burch is a 71 y.o. female with a hx of CAD(LAD calcifications on chest CT), hyperlipidemia, current smoker 30+yrs who presents for follow-up.    Being seen for CAD and hyperlipidemia.  Lipitor increased to 80 mg daily after last visit.  Did not obtain fasting lipid profile as previously planned.  Tolerating Lipitor without any adverse effects.  Denies chest pain.  Previous echocardiogram was normal.  She walks about 3 miles a day. patient still smokes.  No new concerns at this time.   Prior notes Echo 08/2020 EF 60 to 82%, diastolic function abnormal Noncontrast chest CT obtained 05/30/2020 showed LAD calcification.  Emphysema also noted.   Past Medical History:  Diagnosis Date   Allergy    cats    Cancer ([place])    cervical   Hyperlipidemia     Past Surgical History:  Procedure Laterality Date   ABDOMINAL HYSTERECTOMY     1983   COLONOSCOPY WITH PROPOFOL N/A 12/31/2017   Procedure: COLONOSCOPY WITH PROPOFOL;  Surgeon: Virgel Manifold, MD;  Location: [facility] ENDOSCOPY;  Service: Endoscopy;  Laterality: N/A;    Current Medications: Current Meds  Medication Sig   albuterol (VENTOLIN HFA) 108 (90 Base) MCG/ACT inhaler Inhale 1-2 puffs into the lungs every 4 (four) hours as needed for wheezing or shortness of breath.   aspirin EC 81 MG tablet Take 81 mg by mouth daily.   atorvastatin (LIPITOR) 80 MG tablet Take 1 tablet (80 mg total) by mouth daily at 6 PM.   Fluticasone-Umeclidin-Vilant (TRELEGY ELLIPTA) 100-62.5-25 MCG/ACT AEPB Inhale 1 puff into the lungs daily.      Allergies:   Patient has no known allergies.   Social History   Socioeconomic History   Marital status: Widowed    Spouse name: Not on file   Number of children: Not on file   Years of education: Not on file   Highest education level: Not on file  Occupational History   Not on file  Tobacco Use   Smoking status: Every Day    Packs/day: 0.75    Years: 46.00    Pack years: 34.50    Types: Cigarettes   Smokeless tobacco: Never   Tobacco comments:    only smokes a few a day now  Vaping Use   Vaping Use: Never used  Substance and Sexual Activity   Alcohol use: No   Drug use: No   Sexual activity: Never  Other Topics Concern   Not on file  Social History Narrative   Widowed    12 grade education    Social Determinants of Health   Financial Resource Strain: Low Risk    Difficulty of Paying Living Expenses: Not hard at all  Food Insecurity: No Food Insecurity   Worried About Charity fundraiser in the Last Year: Never true   Ran Out of Food in the Last Year: Never true  Transportation Needs: No Transportation Needs   Lack of Transportation (Medical): No   Lack of Transportation (Non-Medical): No  Physical Activity: Insufficiently Active   Days of Exercise per Week: 4 days   Minutes of Exercise per Session: 30 min  Stress: No Stress Concern Present   Feeling of Stress : Not at all  Social Connections: Unknown   Frequency of Communication with Friends and Family: More than three times a week   Frequency of Social Gatherings with Friends and Family: More than three times a week   Attends Religious Services: Not on Electrical engineer or Organizations: Not on file   Attends Archivist Meetings: Not on file   Marital Status: Not on file     Family History: The patient's family history includes Cancer in her mother; Muscular dystrophy in her father, sister, and sister. There is no history of Breast cancer.  ROS:   Please see the history of  present illness.     All other systems reviewed and are negative.  EKGs/Labs/Other Studies Reviewed:    The following studies were reviewed today:   EKG:  EKG not ordered today.    Recent Labs: 01/01/2021: ALT 15; BUN 19; Creatinine, Ser 0.69; Hemoglobin 13.5; Platelets 293.0; Potassium 4.5; Sodium 140; TSH 1.12  Recent Lipid Panel    Component Value Date/Time   CHOL 185 02/27/2021 0837   CHOL 175 08/19/2020 0817   TRIG 111 02/27/2021 0837   HDL 56 02/27/2021 0837   HDL 51 08/19/2020 0817   CHOLHDL 3.3 02/27/2021 0837   VLDL 22 02/27/2021 0837   LDLCALC 107 (H) 02/27/2021 0837   LDLCALC 101 (H) 08/19/2020 0817   LDLCALC 135 (H) 04/14/2017 0822     Risk Assessment/Calculations:          Physical Exam:    VS:  BP 90/60 (BP Location: Left Arm, Patient Position: Sitting, Cuff Size: Normal)    Pulse 80    Ht 5\' 3"  (1.6 m)    Wt 118 lb (53.5 kg)    SpO2 96%    BMI 20.90 kg/m     Wt Readings from Last 3 Encounters:  02/27/21 118 lb (53.5 kg)  01/15/21 116 lb (52.6 kg)  01/01/21 116 lb 3.2 oz (52.7 kg)     GEN:  Well nourished, well developed in no acute distress HEENT: Normal NECK: No JVD; No carotid bruits LYMPHATICS: No lymphadenopathy CARDIAC: RRR, no murmurs, rubs, gallops RESPIRATORY:  Clear to auscultation without rales, wheezing or rhonchi  ABDOMEN: Soft, non-tender, non-distended MUSCULOSKELETAL:  No edema; No deformity  SKIN: Warm and dry NEUROLOGIC:  Alert and oriented x 3 PSYCHIATRIC:  Normal affect   ASSESSMENT:    1. Coronary artery disease involving native coronary artery of native heart without angina pectoris   2. Pure hypercholesterolemia   3. Smoking      PLAN:    In order of problems listed above:  CAD/coronary artery calcification in the LAD noted on chest CT.  Denies chest pain.  Continue aspirin, Lipitor to 80 mg daily.  Echo 08/2020 shows normal systolic and diastolic function, EF 60 to 65%. Hyperlipidemia, previous LDL not at goal.   Continue Lipitor to 80 mg daily.  Update fasting lipid profile.  If cholesterol not adequately controlled, plan to add Zetia. Current smoker, cessation again advised.  Follow-up in yearly or earlier as needed     Medication Adjustments/Labs and Tests Ordered: Current medicines are reviewed at length with the patient today.  Concerns regarding medicines are outlined above.  Orders Placed This Encounter  Procedures   Lipid  panel   EKG 12-Lead     No orders of the defined types were placed in this encounter.    Patient Instructions  Medication Instructions:  Your physician recommends that you continue on your current medications as directed. Please refer to the Current Medication list given to you today.  *If you need a refill on your cardiac medications before your next appointment, please call your pharmacy*   Lab Work:  Your physician recommends that you return for a FASTING lipid profile: Today  Please go to the [facility] for this lab after your office visit.    If you have labs (blood work) drawn today and your tests are completely normal, you will receive your results only by: [place] (if you have MyChart) OR A paper copy in the mail If you have any lab test that is abnormal or we need to change your treatment, we will call you to review the results.   Testing/Procedures: None ordered   Follow-Up: At [facility], you and your health needs are our priority.  As part of our continuing mission to provide you with exceptional heart care, we have created designated Provider Care Teams.  These Care Teams include your primary Cardiologist (physician) and Advanced Practice Providers (APPs -  Physician Assistants and Nurse Practitioners) who all work together to provide you with the care you need, when you need it.  We recommend signing up for the patient portal called "MyChart".  Sign up information is provided on this After Visit Summary.  MyChart is used to  connect with patients for Virtual Visits (Telemedicine).  Patients are able to view lab/test results, encounter notes, upcoming appointments, etc.  Non-urgent messages can be sent to your provider as well.   To learn more about what you can do with MyChart, go to [email or web address].    Your next appointment:   1 year(s)  The format for your next appointment:   In Person  Provider:   You may see Brittany Sable, MD or one of the following Advanced Practice Providers on your designated Care Team:   Murray Hodgkins, NP Christell Faith, PA-C Cadence Kathlen Mody, [place]    Other Instructions     Signed, Brittany Sable, MD  02/27/2021 12:16 PM    [place]

## 2021-03-04 ENCOUNTER — Telehealth: Payer: Self-pay

## 2021-03-04 MED ORDER — EZETIMIBE 10 MG PO TABS: 10.0000 mg | ORAL_TABLET | Freq: Every day | ORAL | 3 refills | Status: DC

## 2021-03-04 NOTE — Telephone Encounter (Signed)
Called patient and relayed the Lipid result note to her. Patient is agreeable to starting the Zetia. Prescription has been sent into [facility]. Patient was grateful for the follow up.

## 2021-03-04 NOTE — Telephone Encounter (Signed)
-----   Message from Kate Sable, MD sent at 02/28/2021  5:49 PM EST ----- LDL/ bad cholesterol still elevated.  Start Zetia 10 mg daily if patient agreeable.

## 2021-06-12 ENCOUNTER — Other Ambulatory Visit: Payer: Self-pay | Admitting: *Deleted

## 2021-06-12 ENCOUNTER — Telehealth: Payer: Self-pay | Admitting: *Deleted

## 2021-06-12 DIAGNOSIS — Z87891 Personal history of nicotine dependence: Secondary | ICD-10-CM

## 2021-06-12 DIAGNOSIS — Z122 Encounter for screening for malignant neoplasm of respiratory organs: Secondary | ICD-10-CM

## 2021-06-12 DIAGNOSIS — F17210 Nicotine dependence, cigarettes, uncomplicated: Secondary | ICD-10-CM

## 2021-06-12 NOTE — Telephone Encounter (Signed)
LMTC to schedule Yearly Lung CA CT Scan. 

## 2021-06-23 ENCOUNTER — Ambulatory Visit: Payer: MEDICARE

## 2021-06-25 ENCOUNTER — Ambulatory Visit
Admission: RE | Admit: 2021-06-25 | Discharge: 2021-06-25 | Disposition: A | Discharge status: Home / Self Care | Payer: MEDICARE | Source: Ambulatory Visit | Attending: Acute Care | Admitting: Acute Care

## 2021-06-25 DIAGNOSIS — Z87891 Personal history of nicotine dependence: Secondary | ICD-10-CM | POA: Diagnosis not present

## 2021-06-25 DIAGNOSIS — F17210 Nicotine dependence, cigarettes, uncomplicated: Secondary | ICD-10-CM | POA: Insufficient documentation

## 2021-06-25 DIAGNOSIS — Z122 Encounter for screening for malignant neoplasm of respiratory organs: Secondary | ICD-10-CM | POA: Diagnosis not present

## 2021-06-27 ENCOUNTER — Other Ambulatory Visit: Payer: Self-pay

## 2021-06-27 ENCOUNTER — Telehealth: Payer: Self-pay | Admitting: Acute Care

## 2021-06-27 DIAGNOSIS — R911 Solitary pulmonary nodule: Secondary | ICD-10-CM

## 2021-06-27 DIAGNOSIS — Z87891 Personal history of nicotine dependence: Secondary | ICD-10-CM

## 2021-06-27 DIAGNOSIS — F17210 Nicotine dependence, cigarettes, uncomplicated: Secondary | ICD-10-CM

## 2021-06-27 NOTE — Telephone Encounter (Signed)
Spoke with patient by phone to review results of LDCT.  New nodule noted on recent scan that radiologist recommends a 6 month follow up instead of waiting 12 months for LDCT.  This is a precaution to monitor the nodule for any changes or growth.  Probably benign but recommendation for monitoring appearance of nodule.  Emphysema and atherosclerosis, as previously noted.  Patient is on statin medication.  No changes in previously seen nodule.  Patient acknowledged understanding and agreed to 6 month follow up CT chest.  Order placed and PCP faxed LDCT results/plan.

## 2021-07-26 IMAGING — CT CT CHEST LUNG CANCER SCREENING LOW DOSE W/O CM
2 of 5 series · 15 of 40 positions shown, 18 images · non-contrast
Comparison: 05/30/2019

CLINICAL DATA: Thirty-four pack-year smoking history. Current
smoker.

EXAM:
CT CHEST WITHOUT CONTRAST LOW-DOSE FOR LUNG CANCER SCREENING
TECHNIQUE: Multidetector CT imaging of the chest was performed following the
standard protocol without IV contrast.

[Series 3: lung 1.00 · axial · 0.56mm/px · z∈[-1206,-921]mm · 12 of 315 slices shown, 15 images]
[im 15/315  mediastinal]
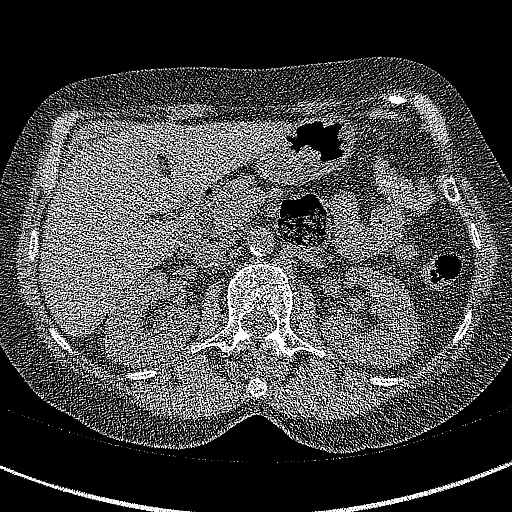
[im 15/315  lung]
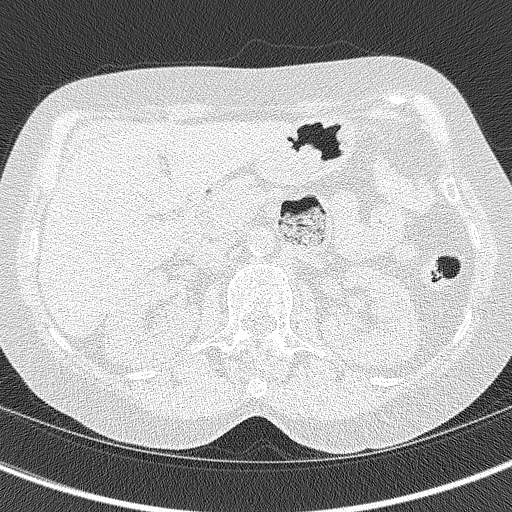
[im 43/315  lung]
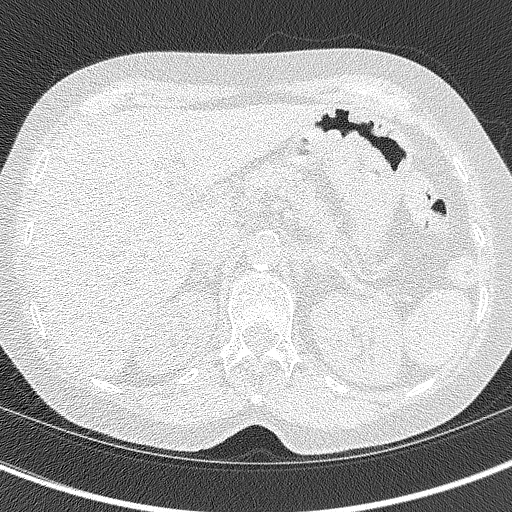
[im 72/315  lung]
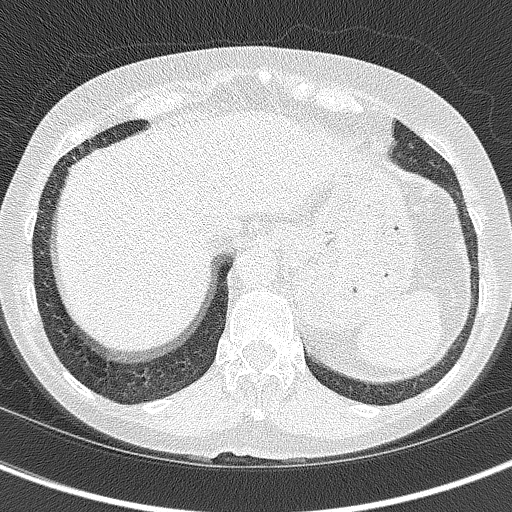
[im 100/315  lung]
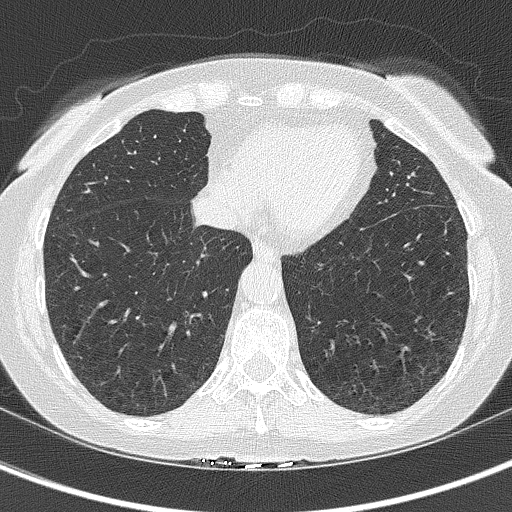
[im 115/315  mediastinal]
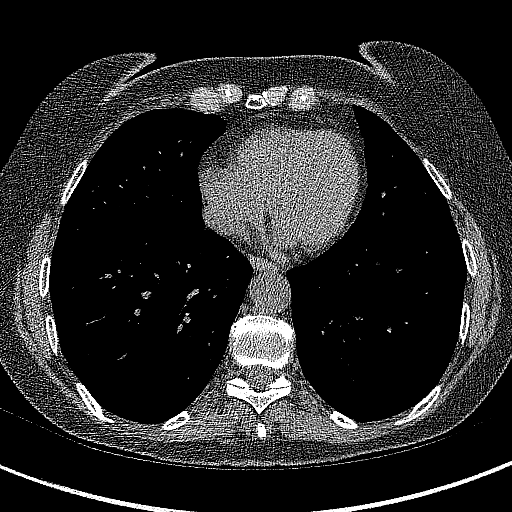
[im 115/315  lung]
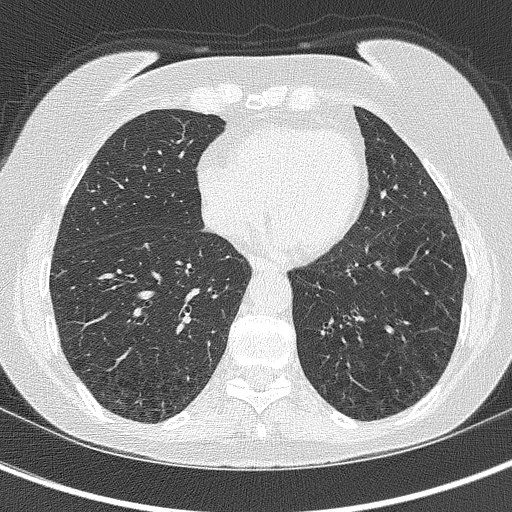
[im 143/315  lung]
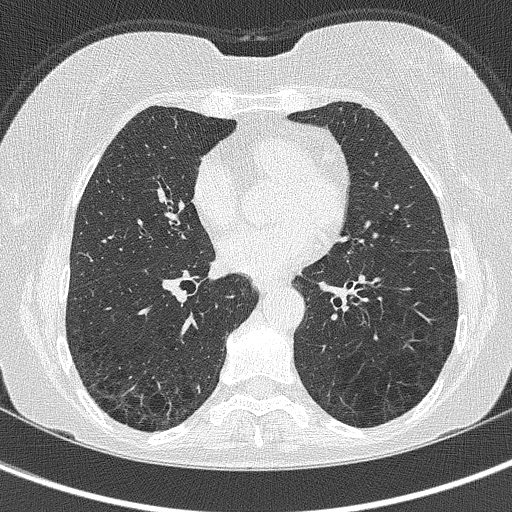
[im 172/315  lung]
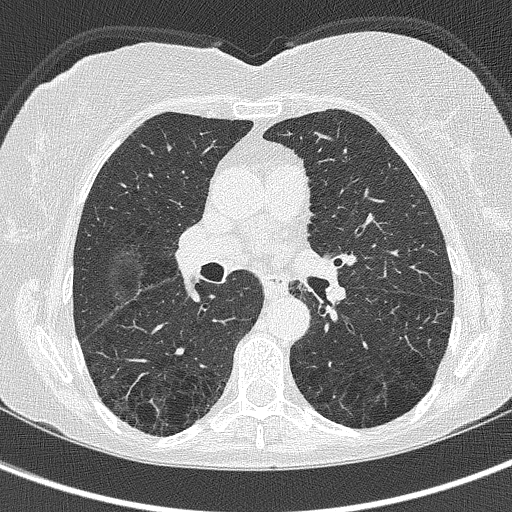
[im 200/315  lung]
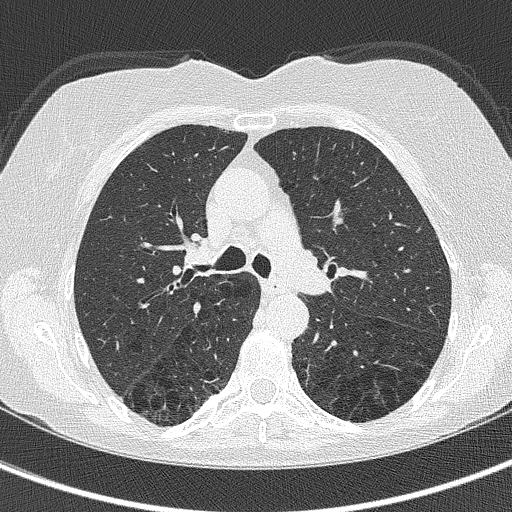
[im 215/315  mediastinal]
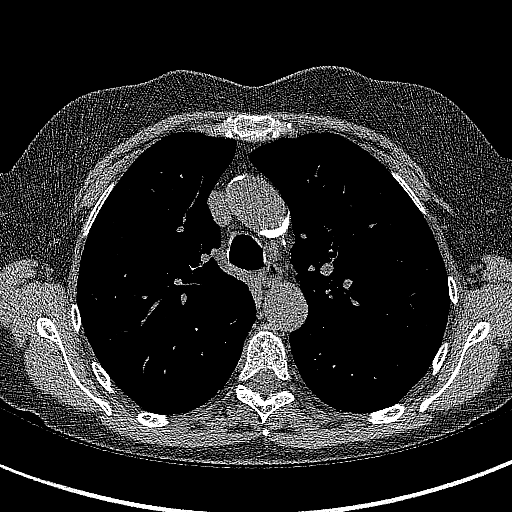
[im 215/315  lung]
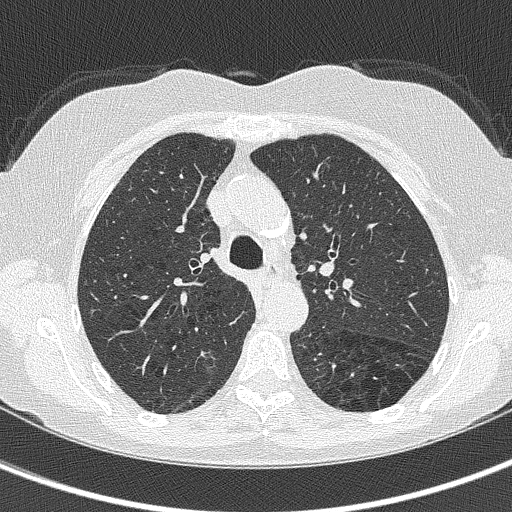
[im 243/315  lung]
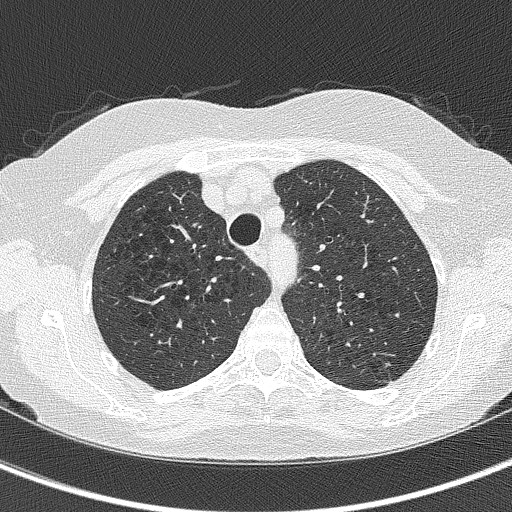
[im 272/315  lung]
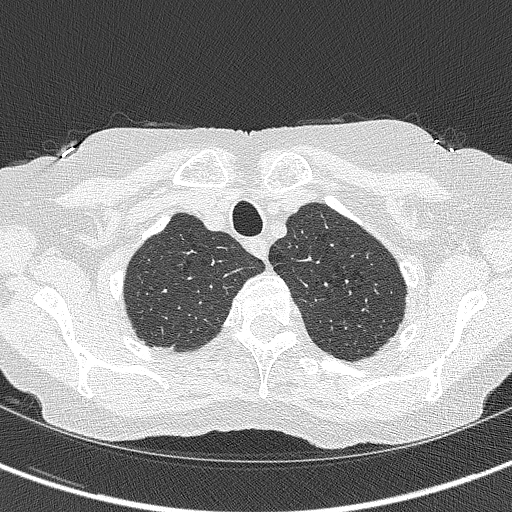
[im 300/315  lung]
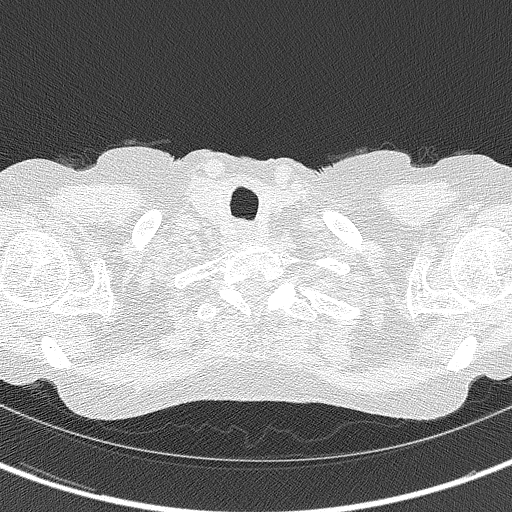

[Series 5: coronals lung 1.00 cor · coronal · 0.56mm/px · 3 of 255 slices shown]
[im 51/255  lung]
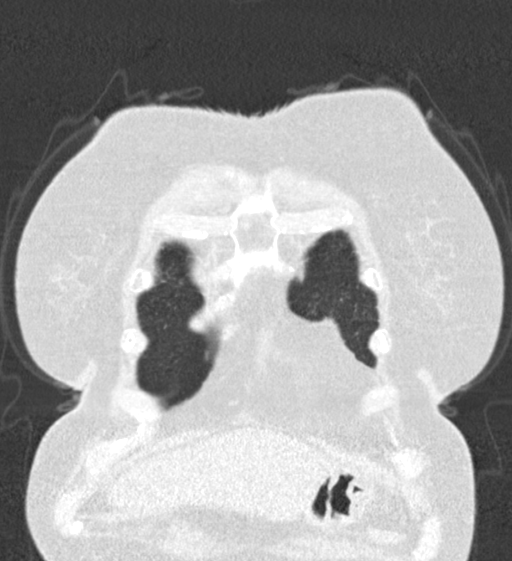
[im 102/255  lung]
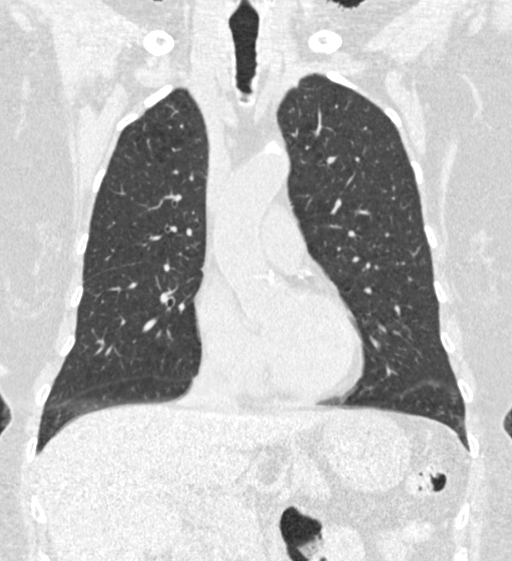
[im 153/255  lung]
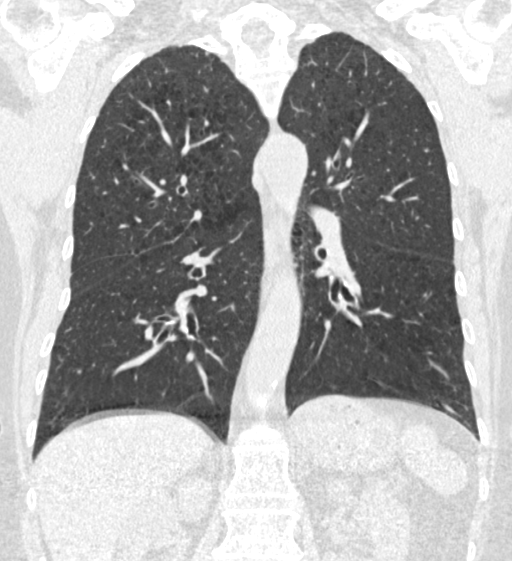

[15 of 40 positions shown; findings below may reference images not displayed]

FINDINGS: Cardiovascular: Aortic atherosclerosis. Tortuous thoracic aorta.
Normal heart size, without pericardial effusion. Lad coronary artery
calcification.

Mediastinum/Nodes: No mediastinal or definite hilar adenopathy,
given limitations of unenhanced CT.

Lungs/Pleura: No pleural fluid. Moderate centrilobular emphysema.
Mild motion degradation inferiorly.

Pulmonary nodules of maximally volume derived equivalent diameter
5.2 mm.

Upper Abdomen: Normal imaged portions of the liver, spleen, stomach,
pancreas, adrenal glands, kidneys.

Musculoskeletal: Osteopenia.
IMPRESSION: 1. Lung-RADS 2, benign appearance or behavior. Continue annual
screening with low-dose chest CT without contrast in 12 months.
2. Aortic Atherosclerosis (ABG5T-IPE.E) and Emphysema (ABG5T-M5K.1).
Coronary artery atherosclerosis.

## 2021-08-13 ENCOUNTER — Ambulatory Visit
Admission: RE | Admit: 2021-08-13 | Discharge: 2021-08-13 | Disposition: A | Discharge status: Home / Self Care | Payer: MEDICARE | Source: Ambulatory Visit | Attending: Internal Medicine | Admitting: Internal Medicine

## 2021-08-13 DIAGNOSIS — Z1231 Encounter for screening mammogram for malignant neoplasm of breast: Secondary | ICD-10-CM | POA: Insufficient documentation

## 2021-10-13 ENCOUNTER — Other Ambulatory Visit: Payer: Self-pay | Admitting: *Deleted

## 2021-10-13 DIAGNOSIS — E785 Hyperlipidemia, unspecified: Secondary | ICD-10-CM

## 2021-10-13 MED ORDER — ATORVASTATIN CALCIUM 80 MG PO TABS: 80.0000 mg | ORAL_TABLET | Freq: Every day | ORAL | 0 refills | Status: DC

## 2021-10-22 ENCOUNTER — Other Ambulatory Visit: Payer: Self-pay | Admitting: Acute Care

## 2021-10-22 DIAGNOSIS — R911 Solitary pulmonary nodule: Secondary | ICD-10-CM

## 2021-10-22 DIAGNOSIS — Z87891 Personal history of nicotine dependence: Secondary | ICD-10-CM

## 2021-10-22 DIAGNOSIS — F17210 Nicotine dependence, cigarettes, uncomplicated: Secondary | ICD-10-CM

## 2021-12-24 ENCOUNTER — Other Ambulatory Visit: Payer: Self-pay

## 2021-12-24 DIAGNOSIS — E785 Hyperlipidemia, unspecified: Secondary | ICD-10-CM

## 2021-12-24 MED ORDER — ATORVASTATIN CALCIUM 80 MG PO TABS: 80.0000 mg | ORAL_TABLET | Freq: Every day | ORAL | 1 refills | Status: DC

## 2021-12-24 NOTE — Telephone Encounter (Signed)
Refills sent to pharmacy. 

## 2021-12-25 ENCOUNTER — Ambulatory Visit: Payer: MEDICARE

## 2022-01-02 ENCOUNTER — Ambulatory Visit
Admission: RE | Admit: 2022-01-02 | Discharge: 2022-01-02 | Disposition: A | Discharge status: Home / Self Care | Payer: MEDICARE | Source: Ambulatory Visit | Attending: Acute Care | Admitting: Acute Care

## 2022-01-02 DIAGNOSIS — Z87891 Personal history of nicotine dependence: Secondary | ICD-10-CM | POA: Diagnosis not present

## 2022-01-02 DIAGNOSIS — F17210 Nicotine dependence, cigarettes, uncomplicated: Secondary | ICD-10-CM | POA: Insufficient documentation

## 2022-01-02 DIAGNOSIS — R911 Solitary pulmonary nodule: Secondary | ICD-10-CM | POA: Diagnosis not present

## 2022-01-02 DIAGNOSIS — J439 Emphysema, unspecified: Secondary | ICD-10-CM | POA: Diagnosis not present

## 2022-01-02 DIAGNOSIS — R918 Other nonspecific abnormal finding of lung field: Secondary | ICD-10-CM | POA: Diagnosis not present

## 2022-01-07 ENCOUNTER — Telehealth: Payer: Self-pay | Admitting: Acute Care

## 2022-01-07 DIAGNOSIS — Z87891 Personal history of nicotine dependence: Secondary | ICD-10-CM

## 2022-01-07 DIAGNOSIS — F17210 Nicotine dependence, cigarettes, uncomplicated: Secondary | ICD-10-CM

## 2022-01-07 NOTE — Telephone Encounter (Signed)
Contacted patient by phone, using two patient identifiers, to review results of LDCT.  No suspicious findings for lung nodule follow up LDCT.  Nodule appears stable and not concerning at this time. Order placed for annual 2024 LDCT.  Patient acknowledged understanding and had no questions.  No PCP currently listed, as provider has changed roles.

## 2022-02-04 ENCOUNTER — Encounter: Payer: MEDICARE | Admitting: Nurse Practitioner

## 2022-02-20 ENCOUNTER — Encounter: Payer: Self-pay | Admitting: Nurse Practitioner

## 2022-02-20 ENCOUNTER — Telehealth: Payer: Self-pay | Admitting: Nurse Practitioner

## 2022-02-20 ENCOUNTER — Other Ambulatory Visit: Payer: Self-pay | Admitting: Nurse Practitioner

## 2022-02-20 ENCOUNTER — Telehealth: Payer: Self-pay

## 2022-02-20 ENCOUNTER — Ambulatory Visit (INDEPENDENT_AMBULATORY_CARE_PROVIDER_SITE_OTHER): Payer: MEDICARE | Admitting: Nurse Practitioner

## 2022-02-20 ENCOUNTER — Telehealth: Payer: Self-pay | Admitting: *Deleted

## 2022-02-20 VITALS — BP 120/58 | HR 82 | Temp 98.1°F | Ht 63.0 in | Wt 117.4 lb

## 2022-02-20 DIAGNOSIS — J439 Emphysema, unspecified: Secondary | ICD-10-CM | POA: Diagnosis not present

## 2022-02-20 DIAGNOSIS — Z1329 Encounter for screening for other suspected endocrine disorder: Secondary | ICD-10-CM

## 2022-02-20 DIAGNOSIS — I2510 Atherosclerotic heart disease of native coronary artery without angina pectoris: Secondary | ICD-10-CM | POA: Diagnosis not present

## 2022-02-20 DIAGNOSIS — E7800 Pure hypercholesterolemia, unspecified: Secondary | ICD-10-CM

## 2022-02-20 DIAGNOSIS — R6889 Other general symptoms and signs: Secondary | ICD-10-CM

## 2022-02-20 LAB — CBC WITH DIFFERENTIAL/PLATELET
Basophils Absolute: 0 10*3/uL (ref 0.0–0.1)
Basophils Relative: 0.3 % (ref 0.0–3.0)
Eosinophils Absolute: 0.1 10*3/uL (ref 0.0–0.7)
Eosinophils Relative: 1.1 % (ref 0.0–5.0)
HCT: 41.8 % (ref 36.0–46.0)
Hemoglobin: 14.1 g/dL (ref 12.0–15.0)
Lymphocytes Relative: 29.4 % (ref 12.0–46.0)
Lymphs Abs: 2.3 10*3/uL (ref 0.7–4.0)
MCHC: 33.8 g/dL (ref 30.0–36.0)
MCV: 94 fl (ref 78.0–100.0)
Monocytes Absolute: 0.7 10*3/uL (ref 0.1–1.0)
Monocytes Relative: 8.8 % (ref 3.0–12.0)
Neutro Abs: 4.7 10*3/uL (ref 1.4–7.7)
Neutrophils Relative %: 60.4 % (ref 43.0–77.0)
Platelets: 313 10*3/uL (ref 150.0–400.0)
RBC: 4.44 Mil/uL (ref 3.87–5.11)
RDW: 13.8 % (ref 11.5–15.5)
WBC: 7.8 10*3/uL (ref 4.0–10.5)

## 2022-02-20 LAB — COMPREHENSIVE METABOLIC PANEL
ALT: 12 U/L (ref 0–35)
AST: 16 U/L (ref 0–37)
Albumin: 4.4 g/dL (ref 3.5–5.2)
Alkaline Phosphatase: 70 U/L (ref 39–117)
BUN: 20 mg/dL (ref 6–23)
CO2: 28 mEq/L (ref 19–32)
Calcium: 9.7 mg/dL (ref 8.4–10.5)
Chloride: 102 mEq/L (ref 96–112)
Creatinine, Ser: 0.84 mg/dL (ref 0.40–1.20)
GFR: 69.17 mL/min (ref >60.00)
Glucose, Bld: 90 mg/dL (ref 70–99)
Potassium: 4.5 mEq/L (ref 3.5–5.1)
Sodium: 141 mEq/L (ref 135–145)
Total Bilirubin: 0.4 mg/dL (ref 0.2–1.2)
Total Protein: 6.7 g/dL (ref 6.0–8.3)

## 2022-02-20 LAB — LIPID PANEL
Cholesterol: 177 mg/dL (ref 0–200)
HDL: 53.6 mg/dL (ref >39.00)
LDL Cholesterol: 102 mg/dL — ABNORMAL HIGH (ref 0–99)
NonHDL: 123.5
Total CHOL/HDL Ratio: 3
Triglycerides: 107 mg/dL (ref 0.0–149.0)
VLDL: 21.4 mg/dL (ref 0.0–40.0)

## 2022-02-20 LAB — TSH: TSH: 1.09 u[IU]/mL (ref 0.35–5.50)

## 2022-02-20 MED ORDER — ATORVASTATIN CALCIUM 80 MG PO TABS: 80.0000 mg | ORAL_TABLET | Freq: Every day | ORAL | 3 refills | Status: DC

## 2022-02-20 MED ORDER — ALBUTEROL SULFATE HFA 108 (90 BASE) MCG/ACT IN AERS: 1.0000 | INHALATION_SPRAY | RESPIRATORY_TRACT | 3 refills | Status: AC | PRN

## 2022-02-20 MED ORDER — TRELEGY ELLIPTA 100-62.5-25 MCG/ACT IN AEPB: 1.0000 | INHALATION_SPRAY | Freq: Every day | RESPIRATORY_TRACT | 5 refills | Status: DC

## 2022-02-20 MED ORDER — EZETIMIBE 10 MG PO TABS: 10.0000 mg | ORAL_TABLET | Freq: Every day | ORAL | 3 refills | Status: DC

## 2022-02-20 NOTE — Telephone Encounter (Signed)
Attempted to call Brittany Burch it just rang and rang.

## 2022-02-20 NOTE — Telephone Encounter (Signed)
Received VM from [facility], Pt's POA. Attempted to return the call but had to leave VM for her to call back.

## 2022-02-20 NOTE — Telephone Encounter (Signed)
Attempted to call Anderson Malta again and it rang but did not give a VM it stated try your call again later.

## 2022-02-20 NOTE — Telephone Encounter (Signed)
Pt daughter called in staying that she would like to speak either with Ollen Gross or her assistant concerning her mom, a neurologist referral, etc. She's available @336$ [phone].

## 2022-02-20 NOTE — Progress Notes (Signed)
Tomasita Morrow, NP-C Phone: [phone]  Brittany Burch is a 72 y.o. female who presents today for transfer of care. She has no complaints or new concerns today. She is doing well on all of her medications and is requesting refills. She is followed by Cardiology and Pulmonology.   HYPERLIPIDEMIA Symptoms Chest pain on exertion:  No   Leg claudication:   No Medications: Compliance- Zetia and Lipitor  Right upper quadrant pain- No  Muscle aches- No Lipid Panel     Component Value Date/Time   CHOL 185 02/27/2021 0837   CHOL 175 08/19/2020 0817   TRIG 111 02/27/2021 0837   HDL 56 02/27/2021 0837   HDL 51 08/19/2020 0817   CHOLHDL 3.3 02/27/2021 0837   VLDL 22 02/27/2021 0837   LDLCALC 107 (H) 02/27/2021 0837   LDLCALC 101 (H) 08/19/2020 0817   LDLCALC 135 (H) 04/14/2017 0822   LABVLDL 23 08/19/2020 0817   COPD: Medication compliance- Trelegy and  Albuterol Rescue inhaler use- Less than 2x/week Dyspnea- No  Wheezing- No  Cough- Yes  Productive- Yes   Social History   Tobacco Use  Smoking Status Every Day   Packs/day: 0.75   Years: 46.00   Total pack years: 34.50   Types: Cigarettes  Smokeless Tobacco Never  Tobacco Comments   only smokes a few a day now    Current Outpatient Medications on File Prior to Visit  Medication Sig Dispense Refill   aspirin EC 81 MG tablet Take 81 mg by mouth daily.     No current facility-administered medications on file prior to visit.     ROS see history of present illness  Objective  Physical Exam Vitals:   02/20/22 0859  BP: (!) 120/58  Pulse: 82  Temp: 98.1 F (36.7 C)  SpO2: 92%    BP Readings from Last 3 Encounters:  02/20/22 (!) 120/58  02/27/21 90/60  01/01/21 118/76   Wt Readings from Last 3 Encounters:  02/20/22 117 lb 6.4 oz (53.3 kg)  02/27/21 118 lb (53.5 kg)  01/15/21 116 lb (52.6 kg)    Physical Exam Constitutional:      General: She is not in acute distress.    Appearance: Normal appearance.   HENT:     Head: Normocephalic.  Cardiovascular:     Rate and Rhythm: Normal rate and regular rhythm.     Heart sounds: Normal heart sounds.  Pulmonary:     Effort: Pulmonary effort is normal.     Breath sounds: Normal breath sounds. No wheezing.  Skin:    General: Skin is warm and dry.  Neurological:     General: No focal deficit present.     Mental Status: She is alert.  Psychiatric:        Mood and Affect: Mood normal.        Behavior: Behavior normal.    Assessment/Plan: Please see individual problem list.  Pulmonary emphysema, unspecified emphysema type ([place]) Assessment & Plan: Chronic. Stable on Trelegy and Albuterol inhalers. Continue. Using albuterol PRN, less than 2x per week unless sick. Denies shortness of breath. Lungs clear on exam. Follow up with Pulmonology. Handicap placard paperwork signed.   Orders: -     Albuterol Sulfate HFA; Inhale 1-2 puffs into the lungs every 4 (four) hours as needed for wheezing or shortness of breath.  Dispense: 54 g; Refill: 3 -     Trelegy Ellipta; Inhale 1 puff into the lungs daily.  Dispense: 180 each; Refill: 5  Pure  hypercholesterolemia Assessment & Plan: Chronic. Continue Zetia 10 mg daily and Lipitor 80 mg daily. Will check lipids today. Encouraged healthy diet.   Orders: -     Comprehensive metabolic panel -     Lipid panel -     Atorvastatin Calcium; Take 1 tablet (80 mg total) by mouth daily at 6 PM.  Dispense: 90 tablet; Refill: 3 -     Ezetimibe; Take 1 tablet (10 mg total) by mouth daily.  Dispense: 90 tablet; Refill: 3  Coronary artery disease involving native coronary artery of native heart without angina pectoris Assessment & Plan: Chronic. Continue ASA 81 mg daily, Lipitor 80 mg daily and Zetia 10 mg daily. Denies chest pain. Follow up with Cardiology.   Orders: -     CBC with Differential/Platelet -     Atorvastatin Calcium; Take 1 tablet (80 mg total) by mouth daily at 6 PM.  Dispense: 90 tablet; Refill: 3 -      Ezetimibe; Take 1 tablet (10 mg total) by mouth daily.  Dispense: 90 tablet; Refill: 3  Thyroid disorder screen -     TSH   Return in about 6 months (around 08/21/2022) for Follow up. Patient also needs Medicare AWV.   Tomasita Morrow, NP-C [place]
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
## 2022-02-20 NOTE — Assessment & Plan Note (Addendum)
Chronic. Stable on Trelegy and Albuterol inhalers. Continue. Using albuterol PRN, less than 2x per week unless sick. Denies shortness of breath. Lungs clear on exam. Follow up with Pulmonology. Handicap placard paperwork signed.

## 2022-02-20 NOTE — Assessment & Plan Note (Addendum)
Chronic. Continue ASA 81 mg daily, Lipitor 80 mg daily and Zetia 10 mg daily. Denies chest pain. Follow up with Cardiology.

## 2022-02-20 NOTE — Telephone Encounter (Signed)
Patient's daughter Jeanmarie Plant (on Dpr) called to ask when next LDCT would be needed.  Family is needing to 'take control' of patient's personal business, etc due to memory deficits.  Next LDCT scheduled for 12/2022.  Daughter wanted to go ahead and schedule it but the schedule is not built out that far. Advised we would call or send letter to schedule closer to date.  Patient given our direct number if she prefers to call and get scheduled date for December.

## 2022-02-20 NOTE — Assessment & Plan Note (Signed)
Chronic. Continue Zetia 10 mg daily and Lipitor 80 mg daily. Will check lipids today. Encouraged healthy diet.

## 2022-02-23 NOTE — Telephone Encounter (Signed)
I called pt back and informed her that her daughter called and spoke to [facility] with concerns about her memory which is why a referral was placed and an appt has been set. Pt stated she does remember her daughter reaching out and did not have any more questions at this time.

## 2022-02-23 NOTE — Telephone Encounter (Signed)
Pt called staying that she has an appt coming up 2/19 and doesn't know why she's been referred over there?? I addresses to her that its a Neuro appt but she still confused about it.

## 2022-02-25 ENCOUNTER — Telehealth: Payer: Self-pay | Admitting: Nurse Practitioner

## 2022-02-25 NOTE — Telephone Encounter (Signed)
LMOM to CB. 

## 2022-02-25 NOTE — Telephone Encounter (Signed)
Patient called and want Brittany Burch to know she did not need two inhalers, albuterol (VENTOLIN HFA) 108 (90 Base) MCG/ACT inhaler and albuterol (VENTOLIN HFA) 108 (90 Base) MCG/ACT inhaler. She doesn't understand why Ollen Gross called in two. She stated she is ok for about 6 months.

## 2022-02-25 NOTE — Telephone Encounter (Signed)
Pt has been informed. Verbalized understanding but I am not sure if she actually understands.

## 2022-02-26 ENCOUNTER — Encounter: Payer: Self-pay | Admitting: *Deleted

## 2022-02-26 ENCOUNTER — Ambulatory Visit (INDEPENDENT_AMBULATORY_CARE_PROVIDER_SITE_OTHER): Payer: MEDICARE | Admitting: *Deleted

## 2022-02-26 VITALS — BP 120/58 | Ht 63.0 in | Wt 117.4 lb

## 2022-02-26 DIAGNOSIS — Z0000 Encounter for general adult medical examination without abnormal findings: Secondary | ICD-10-CM | POA: Diagnosis not present

## 2022-02-26 NOTE — Patient Instructions (Addendum)
Life alert informational link-   [email or web address]    [name] , Thank you for taking time to come for your Medicare Wellness Visit. I appreciate your ongoing commitment to your health goals. Please review the following plan we discussed and let me know if I can assist you in the future.   These are the goals we discussed:  Goals      Maintain Healthy Lifestyle     Stay active Healthy diet        This is a list of the screening recommended for you and due dates:  Health Maintenance  Topic Date Due   COVID-19 Vaccine (4 - 2023-24 season) 03/08/2022*   Flu Shot  04/12/2022*   Screening for Lung Cancer  01/03/2023   Medicare Annual Wellness Visit  02/27/2023   Mammogram  08/14/2023   Colon Cancer Screening  01/01/2028   DTaP/Tdap/Td vaccine (2 - Td or Tdap) 05/16/2028   Pneumonia Vaccine  Completed   DEXA scan (bone density measurement)  Completed   Hepatitis C Screening: USPSTF Recommendation to screen - Ages 67-79 yo.  Completed   Zoster (Shingles) Vaccine  Completed   HPV Vaccine  Aged Out  *Topic was postponed. The date shown is not the original due date.

## 2022-02-26 NOTE — Progress Notes (Signed)
I connected with  Brittany Burch on 02/26/22 by a audio enabled telemedicine application and verified that I am speaking with the correct person using two identifiers.  Patient Location: Home  Provider Location: Office/Clinic  I discussed the limitations of evaluation and management by telemedicine. The patient expressed understanding and agreed to proceed.   Subjective:   Brittany Burch is a 72 y.o. female who presents for Medicare Annual (Subsequent) preventive examination.       Objective:    Today's Vitals   02/26/22 1610  Weight: 117 lb 6.4 oz (53.3 kg)  Height: 5' 3"$  (1.6 m)   Body mass index is 20.8 kg/m.     02/26/2022    4:16 PM 11/27/2019   10:10 AM 11/24/2018    9:45 AM 12/31/2017    7:02 AM 11/17/2017    8:39 AM  Advanced Directives  Does Patient Have a Medical Advance Directive? No Yes Yes Yes No  Type of Advance Directive  Healthcare Power of Attorney Living will Living will   Does patient want to make changes to medical advance directive?  No - Patient declined     Copy of [place] in Chart?  No - copy requested     Would patient like information on creating a medical advance directive? Yes (ED - Information included in AVS)    Yes (MAU/Ambulatory/Procedural Areas - Information given)    Current Medications (verified) Outpatient Encounter Medications as of 02/26/2022  Medication Sig   albuterol (VENTOLIN HFA) 108 (90 Base) MCG/ACT inhaler Inhale 1-2 puffs into the lungs every 4 (four) hours as needed for wheezing or shortness of breath.   aspirin EC 81 MG tablet Take 81 mg by mouth daily.   ezetimibe (ZETIA) 10 MG tablet Take 1 tablet (10 mg total) by mouth daily.   Fluticasone-Umeclidin-Vilant (TRELEGY ELLIPTA) 100-62.5-25 MCG/ACT AEPB Inhale 1 puff into the lungs daily.   atorvastatin (LIPITOR) 80 MG tablet Take 1 tablet (80 mg total) by mouth daily at 6 PM.   No facility-administered encounter medications on file as of  02/26/2022.    Allergies (verified) Patient has no known allergies.   History: Past Medical History:  Diagnosis Date   Allergy    cats    Cancer ([place])    cervical   Hyperlipidemia    Past Surgical History:  Procedure Laterality Date   ABDOMINAL HYSTERECTOMY     1983   COLONOSCOPY WITH PROPOFOL N/A 12/31/2017   Procedure: COLONOSCOPY WITH PROPOFOL;  Surgeon: Virgel Manifold, MD;  Location: [facility] ENDOSCOPY;  Service: Endoscopy;  Laterality: N/A;   Family History  Problem Relation Age of Onset   Cancer Mother        lung cancer smoker died 76   Muscular dystrophy Father    Muscular dystrophy Sister    Muscular dystrophy Sister    Breast cancer Neg Hx    Social History   Socioeconomic History   Marital status: Widowed    Spouse name: Not on file   Number of children: Not on file   Years of education: Not on file   Highest education level: Not on file  Occupational History   Not on file  Tobacco Use   Smoking status: Every Day    Packs/day: 0.75    Years: 46.00    Total pack years: 34.50    Types: Cigarettes   Smokeless tobacco: Never   Tobacco comments:    only smokes a few a day now  Vaping Use   Vaping Use: Never used  Substance and Sexual Activity   Alcohol use: No   Drug use: No   Sexual activity: Never  Other Topics Concern   Not on file  Social History Narrative   Widowed    12 grade education    Social Determinants of Health   Financial Resource Strain: Low Risk  (02/26/2022)   Overall Financial Resource Strain (CARDIA)    Difficulty of Paying Living Expenses: Not hard at all  Food Insecurity: No Food Insecurity (02/26/2022)   Hunger Vital Sign    Worried About Running Out of Food in the Last Year: Never true    Ran Out of Food in the Last Year: Never true  Transportation Needs: No Transportation Needs (02/26/2022)   PRAPARE - Hydrologist (Medical): No    Lack of Transportation (Non-Medical): No  Physical  Activity: Sufficiently Active (02/26/2022)   Exercise Vital Sign    Days of Exercise per Week: 7 days    Minutes of Exercise per Session: 60 min  Stress: No Stress Concern Present (02/26/2022)   [place]    Feeling of Stress : Not at all  Social Connections: Moderately Isolated (02/26/2022)   Social Connection and Isolation Panel [NHANES]    Frequency of Communication with Friends and Family: More than three times a week    Frequency of Social Gatherings with Friends and Family: More than three times a week    Attends Religious Services: More than 4 times per year    Active Member of [employer] or Organizations: No    Attends Archivist Meetings: Never    Marital Status: Widowed    Tobacco Counseling Ready to quit: Not Answered Counseling given: Not Answered Tobacco comments: only smokes a few a day now   Clinical Intake:  Pre-visit preparation completed: Yes  Pain : No/denies pain     BMI - recorded: 20.8 Nutritional Status: BMI of 19-24  Normal Nutritional Risks: None Diabetes: No  How often do you need to have someone help you when you read instructions, pamphlets, or other written materials from your doctor or pharmacy?: 1 - Never  Diabetic? No  Interpreter Needed?: No  Information entered by :: Jari Favre, CMA   Activities of Daily Living     No data to display          Patient Care Team: Tomasita Morrow, NP as PCP - General (Nurse Practitioner) Kate Sable, MD as PCP - Cardiology (Cardiology)  Indicate any recent Medical Services you may have received from other than [facility] providers in the past year (date may be approximate).     Assessment:   This is a routine wellness examination for Brittany Burch.  Hearing/Vision screen No results found.  Dietary issues and exercise activities discussed:     Goals Addressed   None    Depression Screen    02/26/2022    4:14 PM 02/20/2022     9:01 AM 01/15/2021    8:31 AM 01/01/2021   11:27 AM 11/27/2019   10:08 AM 08/25/2019    9:49 AM 11/24/2018    9:51 AM  PHQ 2/9 Scores  PHQ - 2 Score 0 0 0 3 0 0 0    Fall Risk    02/26/2022    4:14 PM 02/20/2022    9:00 AM 01/15/2021    8:32 AM 01/01/2021   11:27 AM 11/27/2019   10:12  AM  Fall Risk   Falls in the past year? 0 0 0 0 0  Number falls in past yr: 0 0 0 0 0  Injury with Fall? 0   0   Risk for fall due to : No Fall Risks No Fall Risks  No Fall Risks   Follow up Falls evaluation completed Falls evaluation completed Falls evaluation completed Falls evaluation completed Falls evaluation completed    [place]:  Any stairs in or around the home? No  If so, are there any without handrails? No  Home free of loose throw rugs in walkways, pet beds, electrical cords, etc? Yes  Adequate lighting in your home to reduce risk of falls? Yes   ASSISTIVE DEVICES UTILIZED TO PREVENT FALLS:  Life alert? No  Use of a cane, walker or w/c? No  Grab bars in the bathroom? No  Shower chair or bench in shower? No  Elevated toilet seat or a handicapped toilet? No    Cognitive Function:    11/17/2017    8:45 AM  MMSE - Mini Mental State Exam  Orientation to time 5  Orientation to Place 5  Registration 3  Attention/ Calculation 5  Recall 3  Language- name 2 objects 2  Language- repeat 1  Language- follow 3 step command 3  Language- read & follow direction 1  Write a sentence 1  Copy design 1  Total score 30        11/27/2019   10:12 AM 11/24/2018    9:53 AM  6CIT Screen  What Year? 0 points 0 points  What month? 0 points 0 points  What time? 0 points 0 points  Count back from 20 0 points 0 points  Months in reverse 0 points 0 points  Repeat phrase 0 points 0 points  Total Score 0 points 0 points    Immunizations Immunization History  Administered Date(s) Administered   Fluad Quad(high Dose 65+) 12/15/2019, 10/29/2020    Influenza, High Dose Seasonal PF 09/24/2017   Influenza-Unspecified 10/17/2018   Moderna Covid-19 Vaccine Bivalent Booster 25yr & up 11/14/2020   Moderna SARS-COV2 Booster Vaccination 11/03/2019   Moderna Sars-Covid-2 Vaccination 03/11/2019, 04/08/2019   Pneumococcal Conjugate-13 06/22/2016   Pneumococcal Polysaccharide-23 09/04/2017   Tdap 05/17/2018   Zoster Recombinat (Shingrix) 11/23/2017, 01/31/2018    TDAP status: Up to date  Flu Vaccine status: Declined, Education has been provided regarding the importance of this vaccine but patient still declined. Advised may receive this vaccine at local pharmacy or Health Dept. Aware to provide a copy of the vaccination record if obtained from local pharmacy or Health Dept. Verbalized acceptance and understanding.  Pneumococcal vaccine status: Up to date  Covid-19 vaccine status: Information provided on how to obtain vaccines.   Qualifies for Shingles Vaccine? Yes   Zostavax completed Yes   Shingrix Completed?: Yes  Screening Tests Health Maintenance  Topic Date Due   COVID-19 Vaccine (4 - 2023-24 season) 03/08/2022 (Originally 09/12/2021)   INFLUENZA VACCINE  04/12/2022 (Originally 08/12/2021)   Lung Cancer Screening  01/03/2023   Medicare Annual Wellness (AWV)  02/27/2023   MAMMOGRAM  08/14/2023   COLONOSCOPY (Pts 45-451yrInsurance coverage will need to be confirmed)  01/01/2028   DTaP/Tdap/Td (2 - Td or Tdap) 05/16/2028   Pneumonia Vaccine 689Years old  Completed   DEXA SCAN  Completed   Hepatitis C Screening  Completed   Zoster Vaccines- Shingrix  Completed   HPV VACCINES  Aged  Out    Health Maintenance  There are no preventive care reminders to display for this patient.   Colorectal cancer screening: Type of screening: Colonoscopy. Completed 12/31/2017. Repeat every 10 years  Mammogram status: Completed 08/13/21. Repeat every year  Bone Density status: Completed 10/2017. Results reflect: Bone density results: NORMAL.  Repeat every 2-5 years.  Lung Cancer Screening: (Low Dose CT Chest recommended if Age 22-80 years, 30 pack-year currently smoking OR have quit w/in 15years.) does qualify.    Additional Screening:  Hepatitis C Screening: does qualify; Completed 04/14/2017  Vision Screening: Recommended annual ophthalmology exams for early detection of glaucoma and other disorders of the eye. Is the patient up to date with their annual eye exam?  Yes  Who is the provider or what is the name of the office in which the patient attends annual eye exams? Can not remember the name of doctor or office. If pt is not established with a provider, would they like to be referred to a provider to establish care? No .   Dental Screening: Recommended annual dental exams for proper oral hygiene  Community Resource Referral / Chronic Care Management: CRR required this visit?  No   CCM required this visit?  No      Plan:     I have personally reviewed and noted the following in the patient's chart:   Medical and social history Use of alcohol, tobacco or illicit drugs  Current medications and supplements including opioid prescriptions. Patient is not currently taking opioid prescriptions. Functional ability and status Nutritional status Physical activity Advanced directives List of other physicians Hospitalizations, surgeries, and ER visits in previous 12 months Vitals Screenings to include cognitive, depression, and falls Referrals and appointments  In addition, I have reviewed and discussed with patient certain preventive protocols, quality metrics, and best practice recommendations. A written personalized care plan for preventive services as well as general preventive health recommendations were provided to patient.     Cannon Kettle, [place]   02/26/2022   Nurse Notes: I spoke with Ms Harren over the phone today for a total of 18 minutes. I will mail out Advance Directives packet & information about Life  alert if not able to send through mychart.

## 2022-03-02 ENCOUNTER — Encounter: Payer: Self-pay | Admitting: Physician Assistant

## 2022-03-02 ENCOUNTER — Ambulatory Visit (INDEPENDENT_AMBULATORY_CARE_PROVIDER_SITE_OTHER): Payer: MEDICARE | Admitting: Physician Assistant

## 2022-03-02 VITALS — BP 128/64 | HR 80 | Resp 18 | Ht 63.0 in | Wt 118.0 lb

## 2022-03-02 DIAGNOSIS — I2510 Atherosclerotic heart disease of native coronary artery without angina pectoris: Secondary | ICD-10-CM

## 2022-03-02 DIAGNOSIS — R413 Other amnesia: Secondary | ICD-10-CM | POA: Diagnosis not present

## 2022-03-02 MED ORDER — DONEPEZIL HCL 5 MG PO TABS: 5.0000 mg | ORAL_TABLET | Freq: Every day | ORAL | 1 refills | Status: DC

## 2022-03-02 NOTE — Patient Instructions (Addendum)
It was a pleasure to see you today at our office.   Recommendations:  Neurocognitive evaluation at our office MRI of the brain, the radiology office will call you to arrange you appointment, will call you with the results Check labs today Start Donepezil 5 mg daily. Side effects discussed   Follow up in 6 months  Whom to call:  Memory  decline, memory medications: Call our office [phone]   For psychiatric meds, mood meds: Please have your primary care physician manage these medications.   Counseling regarding caregiver distress, including caregiver depression, anxiety and issues regarding community resources, adult day care programs, adult living facilities, or memory care questions:   Feel free to contact [place], Social Worker at [phone]   For assessment of decision of mental capacity and competency:  Call Dr. Anthoney Harada, geriatric psychiatrist at [phone]  For guidance in geriatric dementia issues please call [facility] [phone]  For guidance regarding [facility] and if placement were needed at the facility, contact Arnell Asal, Social Worker tel: [phone]  If you have any severe symptoms of a stroke, or other severe issues such as confusion,severe chills or fever, etc call 911 or go to the ER as you may need to be evaluated further   Feel free to visit Facebook page " [email or web address]" for tips of how to care for people with memory problems.      RECOMMENDATIONS FOR ALL PATIENTS WITH MEMORY PROBLEMS: 1. Continue to exercise (Recommend 30 minutes of walking everyday, or 3 hours every week) 2. Increase social interactions - continue going to [place] and enjoy social gatherings with friends and family 3. Eat healthy, avoid fried foods and eat more fruits and vegetables 4. Maintain adequate blood pressure, blood sugar, and blood cholesterol level. Reducing the risk of stroke and cardiovascular disease also helps  promoting better memory. 5. Avoid stressful situations. Live a simple life and avoid aggravations. Organize your time and prepare for the next day in anticipation. 6. Sleep well, avoid any interruptions of sleep and avoid any distractions in the bedroom that may interfere with adequate sleep quality 7. Avoid sugar, avoid sweets as there is a strong link between excessive sugar intake, diabetes, and cognitive impairment We discussed the Mediterranean diet, which has been shown to help patients reduce the risk of progressive memory disorders and reduces cardiovascular risk. This includes eating fish, eat fruits and green leafy vegetables, nuts like almonds and hazelnuts, walnuts, and also use olive oil. Avoid fast foods and fried foods as much as possible. Avoid sweets and sugar as sugar use has been linked to worsening of memory function.  There is always a concern of gradual progression of memory problems. If this is the case, then we may need to adjust level of care according to patient needs. Support, both to the patient and caregiver, should then be put into place.      You have been referred for a neuropsychological evaluation (i.e., evaluation of memory and thinking abilities). Please bring someone with you to this appointment if possible, as it is helpful for the doctor to hear from both you and another adult who knows you well. Please bring eyeglasses and hearing aids if you wear them.    The evaluation will take approximately 3 hours and has two parts:   The first part is a clinical interview with the neuropsychologist (Dr. Melvyn Novas or Dr. Nicole Kindred). During the interview, the neuropsychologist will speak with you and the individual you  brought to the appointment.    The second part of the evaluation is testing with the doctor's technician Hinton Dyer or Maudie Mercury). During the testing, the technician will ask you to remember different types of material, solve problems, and answer some questionnaires. Your  family member will not be present for this portion of the evaluation.   Please note: We must reserve several hours of the neuropsychologist's time and the psychometrician's time for your evaluation appointment. As such, there is a No-Show fee of $100. If you are unable to attend any of your appointments, please contact our office as soon as possible to reschedule.    FALL PRECAUTIONS: Be cautious when walking. Scan the area for obstacles that may increase the risk of trips and falls. When getting up in the mornings, sit up at the edge of the bed for a few minutes before getting out of bed. Consider elevating the bed at the head end to avoid drop of blood pressure when getting up. Walk always in a well-lit room (use night lights in the walls). Avoid area rugs or power cords from appliances in the middle of the walkways. Use a walker or a cane if necessary and consider physical therapy for balance exercise. Get your eyesight checked regularly.  FINANCIAL OVERSIGHT: Supervision, especially oversight when making financial decisions or transactions is also recommended.  HOME SAFETY: Consider the safety of the kitchen when operating appliances like stoves, microwave oven, and blender. Consider having supervision and share cooking responsibilities until no longer able to participate in those. Accidents with firearms and other hazards in the house should be identified and addressed as well.   ABILITY TO BE LEFT ALONE: If patient is unable to contact 911 operator, consider using LifeLine, or when the need is there, arrange for someone to stay with patients. Smoking is a fire hazard, consider supervision or cessation. Risk of wandering should be assessed by caregiver and if detected at any point, supervision and safe proof recommendations should be instituted.  MEDICATION SUPERVISION: Inability to self-administer medication needs to be constantly addressed. Implement a mechanism to ensure safe administration of  the medications.   DRIVING: Regarding driving, in patients with progressive memory problems, driving will be impaired. We advise to have someone else do the driving if trouble finding directions or if minor accidents are reported. Independent driving assessment is available to determine safety of driving.   If you are interested in the driving assessment, you can contact the following:  [employer] in [place]  [place] [phone] or [phone]    [place] refers to food and lifestyle choices that are based on the traditions of countries located on the [employer]. This way of eating has been shown to help prevent certain conditions and improve outcomes for people who have chronic diseases, like kidney disease and heart disease. What are tips for following this plan? Lifestyle  Cook and eat meals together with your family, when possible. Drink enough fluid to keep your urine clear or pale yellow. Be physically active every day. This includes: Aerobic exercise like running or swimming. Leisure activities like gardening, walking, or housework. Get 7-8 hours of sleep each night. If recommended by your health care provider, drink red wine in moderation. This means 1 glass a day for nonpregnant women and 2 glasses a day for men. A glass of wine equals 5 oz (150 mL). Reading food labels  Check  the serving size of packaged foods. For foods such as rice and pasta, the serving size refers to the amount of cooked product, not dry. Check the total fat in packaged foods. Avoid foods that have saturated fat or trans fats. Check the ingredients list for added sugars, such as corn syrup. Shopping  At the grocery store, buy most of your food from the areas near the walls of the store. This includes: Fresh fruits and vegetables  (produce). Grains, beans, nuts, and seeds. Some of these may be available in unpackaged forms or large amounts (in bulk). Fresh seafood. Poultry and eggs. Low-fat dairy products. Buy whole ingredients instead of prepackaged foods. Buy fresh fruits and vegetables in-season from local farmers markets. Buy frozen fruits and vegetables in resealable bags. If you do not have access to quality fresh seafood, buy precooked frozen shrimp or canned fish, such as tuna, salmon, or sardines. Buy small amounts of raw or cooked vegetables, salads, or olives from the deli or salad bar at your store. Stock your pantry so you always have certain foods on hand, such as olive oil, canned tuna, canned tomatoes, rice, pasta, and beans. Cooking  Cook foods with extra-virgin olive oil instead of using butter or other vegetable oils. Have meat as a side dish, and have vegetables or grains as your main dish. This means having meat in small portions or adding small amounts of meat to foods like pasta or stew. Use beans or vegetables instead of meat in common dishes like chili or lasagna. Experiment with different cooking methods. Try roasting or broiling vegetables instead of steaming or sauteing them. Add frozen vegetables to soups, stews, pasta, or rice. Add nuts or seeds for added healthy fat at each meal. You can add these to yogurt, salads, or vegetable dishes. Marinate fish or vegetables using olive oil, lemon juice, garlic, and fresh herbs. Meal planning  Plan to eat 1 vegetarian meal one day each week. Try to work up to 2 vegetarian meals, if possible. Eat seafood 2 or more times a week. Have healthy snacks readily available, such as: Vegetable sticks with hummus. Greek yogurt. Fruit and nut trail mix. Eat balanced meals throughout the week. This includes: Fruit: 2-3 servings a day Vegetables: 4-5 servings a day Low-fat dairy: 2 servings a day Fish, poultry, or lean meat: 1 serving a day Beans and  legumes: 2 or more servings a week Nuts and seeds: 1-2 servings a day Whole grains: 6-8 servings a day Extra-virgin olive oil: 3-4 servings a day Limit red meat and sweets to only a few servings a month What are my food choices? Mediterranean diet Recommended Grains: Whole-grain pasta. Brown rice. Bulgar wheat. Polenta. Couscous. Whole-wheat bread. [place]. Vegetables: Artichokes. Beets. Broccoli. Cabbage. Carrots. Eggplant. Green beans. Chard. Kale. Spinach. Onions. Leeks. Peas. Squash. Tomatoes. Peppers. Radishes. Fruits: Apples. Apricots. Avocado. Berries. Bananas. Cherries. Dates. Figs. Grapes. Lemons. Melon. Oranges. Peaches. Plums. Pomegranate. Meats and other protein foods: Beans. Almonds. Sunflower seeds. Pine nuts. Peanuts. [place]. Salmon. Scallops. Shrimp. [place]. Tilapia. Clams. Oysters. Eggs. Dairy: Low-fat milk. Cheese. Greek yogurt. Beverages: Water. Red wine. Herbal tea. Fats and oils: Extra virgin olive oil. Avocado oil. Grape seed oil. Sweets and desserts: [place] yogurt with honey. Baked apples. Poached pears. Trail mix. Seasoning and other foods: Basil. Cilantro. Coriander. Cumin. Mint. Parsley. Sage. Rosemary. Tarragon. Garlic. Oregano. Thyme. Pepper. Balsalmic vinegar. Tahini. Hummus. Tomato sauce. Olives. Mushrooms. Limit these Grains: Prepackaged pasta or rice dishes. Prepackaged cereal with added sugar. Vegetables: Deep fried  potatoes (french fries). Fruits: Fruit canned in syrup. Meats and other protein foods: Beef. Pork. Lamb. Poultry with skin. Hot dogs. [place] Salines. Dairy: Ice cream. Sour cream. Whole milk. Beverages: Juice. Sugar-sweetened soft drinks. Beer. Liquor and spirits. Fats and oils: Butter. Canola oil. Vegetable oil. Beef fat (tallow). Lard. Sweets and desserts: Cookies. Cakes. Pies. Candy. Seasoning and other foods: Mayonnaise. Premade sauces and marinades. The items listed may not be a complete list. Talk with your dietitian about what dietary choices  are right for you. Summary The Mediterranean diet includes both food and lifestyle choices. Eat a variety of fresh fruits and vegetables, beans, nuts, seeds, and whole grains. Limit the amount of red meat and sweets that you eat. Talk with your health care provider about whether it is safe for you to drink red wine in moderation. This means 1 glass a day for nonpregnant women and 2 glasses a day for men. A glass of wine equals 5 oz (150 mL). This information is not intended to replace advice given to you by your health care provider. Make sure you discuss any questions you have with your health care provider. Document Released: 08/22/2015 Document Revised: 09/24/2015 Document Reviewed: 08/22/2015 Elsevier Interactive Patient Education  2017 [employer].
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
## 2022-03-06 ENCOUNTER — Other Ambulatory Visit: Payer: Self-pay

## 2022-03-06 ENCOUNTER — Telehealth: Payer: Self-pay | Admitting: Physician Assistant

## 2022-03-06 MED ORDER — DONEPEZIL HCL 5 MG PO TABS: 5.0000 mg | ORAL_TABLET | Freq: Every day | ORAL | 1 refills | Status: DC

## 2022-03-06 NOTE — Telephone Encounter (Signed)
Sent to mail order

## 2022-03-06 NOTE — Telephone Encounter (Signed)
Patient needs her new RX donepezil '5mg'$  sent to [facility] since she will be taking it every day instead of her local pharmacy.

## 2022-03-13 ENCOUNTER — Ambulatory Visit: Payer: MEDICARE | Attending: Cardiology | Admitting: Cardiology

## 2022-03-13 ENCOUNTER — Encounter: Payer: Self-pay | Admitting: Cardiology

## 2022-03-13 VITALS — BP 122/72 | HR 89 | Ht 63.0 in | Wt 119.8 lb

## 2022-03-13 DIAGNOSIS — I2510 Atherosclerotic heart disease of native coronary artery without angina pectoris: Secondary | ICD-10-CM | POA: Insufficient documentation

## 2022-03-13 DIAGNOSIS — E7800 Pure hypercholesterolemia, unspecified: Secondary | ICD-10-CM | POA: Insufficient documentation

## 2022-03-13 DIAGNOSIS — F17200 Nicotine dependence, unspecified, uncomplicated: Secondary | ICD-10-CM | POA: Insufficient documentation

## 2022-03-13 NOTE — Patient Instructions (Signed)
Medication Instructions:  Your physician recommends that you continue on your current medications as directed. Please refer to the Current Medication list given to you today.  *If you need a refill on your cardiac medications before your next appointment, please call your pharmacy*   Lab Work: -None ordered If you have labs (blood work) drawn today and your tests are completely normal, you will receive your results only by: [place] (if you have MyChart) OR A paper copy in the mail If you have any lab test that is abnormal or we need to change your treatment, we will call you to review the results.   Testing/Procedures: -None ordered   Follow-Up: At [facility], you and your health needs are our priority.  As part of our continuing mission to provide you with exceptional heart care, we have created designated Provider Care Teams.  These Care Teams include your primary Cardiologist (physician) and Advanced Practice Providers (APPs -  Physician Assistants and Nurse Practitioners) who all work together to provide you with the care you need, when you need it.  We recommend signing up for the patient portal called "MyChart".  Sign up information is provided on this After Visit Summary.  MyChart is used to connect with patients for Virtual Visits (Telemedicine).  Patients are able to view lab/test results, encounter notes, upcoming appointments, etc.  Non-urgent messages can be sent to your provider as well.   To learn more about what you can do with MyChart, go to [email or web address].    Your next appointment:   1 year(s)  Provider:   You may see Kate Sable, MD or one of the following Advanced Practice Providers on your designated Care Team:   Murray Hodgkins, NP Christell Faith, PA-C Cadence Kathlen Mody, PA-C Gerrie Nordmann, NP  Other Instructions -None

## 2022-03-13 NOTE — Progress Notes (Signed)
Cardiology Office Note:    Date:  03/13/2022   ID:  Brittany Burch, DOB Nov 19, 1950, MRN [id number]  PCP:  Tomasita Morrow, NP   [facility] Providers Cardiologist:  Kate Sable, MD     Referring MD: Tomasita Morrow, NP   Chief Complaint  Patient presents with   Follow-up    12 month f/u, no new cardiac concerns      History of Present Illness:    Brittany Burch is a 72 y.o. female with a hx of CAD(LAD calcifications on chest CT), hyperlipidemia, current smoker 30+yrs who presents for follow-up.    Feels well, denies chest pain or shortness of breath, she still smokes, tolerating medications as prescribed.  She states eating healthier, cutting out fried foods, walks about 6 miles a day now.  Doing well, no new concerns at this time.   Prior notes Echo 08/2020 EF 60 to [id number], diastolic function normal Noncontrast chest CT obtained 05/30/2020 showed LAD calcification.  Emphysema also noted.   Past Medical History:  Diagnosis Date   Allergy    cats    Cancer ([place])    cervical   Hyperlipidemia     Past Surgical History:  Procedure Laterality Date   ABDOMINAL HYSTERECTOMY     1983   COLONOSCOPY WITH PROPOFOL N/A 12/31/2017   Procedure: COLONOSCOPY WITH PROPOFOL;  Surgeon: Virgel Manifold, MD;  Location: [facility] ENDOSCOPY;  Service: Endoscopy;  Laterality: N/A;    Current Medications: Current Meds  Medication Sig   albuterol (VENTOLIN HFA) 108 (90 Base) MCG/ACT inhaler Inhale 1-2 puffs into the lungs every 4 (four) hours as needed for wheezing or shortness of breath.   aspirin EC 81 MG tablet Take 81 mg by mouth daily.   atorvastatin (LIPITOR) 80 MG tablet Take 1 tablet (80 mg total) by mouth daily at 6 PM.   donepezil (ARICEPT) 5 MG tablet Take 1 tablet (5 mg total) by mouth daily.   ezetimibe (ZETIA) 10 MG tablet Take 1 tablet (10 mg total) by mouth daily.   Fluticasone-Umeclidin-Vilant (TRELEGY ELLIPTA) 100-62.5-25 MCG/ACT AEPB Inhale 1 puff into the lungs  daily.     Allergies:   Patient has no known allergies.   Social History   Socioeconomic History   Marital status: Widowed    Spouse name: Not on file   Number of children: 2   Years of education: Not on file   Highest education level: Not on file  Occupational History   Not on file  Tobacco Use   Smoking status: Every Day    Packs/day: 0.50    Years: 46.00    Total pack years: 23.00    Types: Cigarettes   Smokeless tobacco: Never   Tobacco comments:    only smokes a few a day now  Vaping Use   Vaping Use: Never used  Substance and Sexual Activity   Alcohol use: No   Drug use: No   Sexual activity: Never  Other Topics Concern   Not on file  Social History Narrative   Widowed    12 grade education    Lives with daughter   Retired   Right handed   One floor home   Social Determinants of Health   Financial Resource Strain: [place]  (02/26/2022)   Overall Financial Resource Strain (CARDIA)    Difficulty of Paying Living Expenses: Not hard at all  Food Insecurity: No [place] (02/26/2022)   Hunger Vital Sign    Worried About Running  Out of Food in the Last Year: Never true    Ran Out of Food in the Last Year: Never true  Transportation Needs: No Transportation Needs (02/26/2022)   PRAPARE - Hydrologist (Medical): No    Lack of Transportation (Non-Medical): No  Physical Activity: Sufficiently Active (02/26/2022)   Exercise Vital Sign    Days of Exercise per Week: 7 days    Minutes of Exercise per Session: 60 min  Stress: No Stress Concern Present (02/26/2022)   [place]    Feeling of Stress : Not at all  Social Connections: Moderately Isolated (02/26/2022)   Social Connection and Isolation Panel [NHANES]    Frequency of Communication with Friends and Family: More than three times a week    Frequency of Social Gatherings with Friends and Family: More than three  times a week    Attends Religious Services: More than 4 times per year    Active Member of [employer] or Organizations: No    Attends Archivist Meetings: Never    Marital Status: Widowed     Family History: The patient's family history includes Cancer in her mother; Muscular dystrophy in her father, sister, and sister. There is no history of Breast cancer.  ROS:   Please see the history of present illness.     All other systems reviewed and are negative.  EKGs/Labs/Other Studies Reviewed:    The following studies were reviewed today:   EKG:  EKG is ordered today.  EKG shows normal sinus rhythm  Recent Labs: 02/20/2022: ALT 12; BUN 20; Creatinine, Ser 0.84; Hemoglobin 14.1; Platelets 313.0; Potassium 4.5; Sodium 141; TSH 1.09  Recent Lipid Panel    Component Value Date/Time   CHOL 177 02/20/2022 0917   CHOL 175 08/19/2020 0817   TRIG 107.0 02/20/2022 0917   HDL 53.60 02/20/2022 0917   HDL 51 08/19/2020 0817   CHOLHDL 3 02/20/2022 0917   VLDL 21.4 02/20/2022 0917   LDLCALC 102 (H) 02/20/2022 0917   LDLCALC 101 (H) 08/19/2020 0817   LDLCALC 135 (H) 04/14/2017 0822     Risk Assessment/Calculations:          Physical Exam:    VS:  BP 122/72 (BP Location: Left Arm, Patient Position: Sitting, Cuff Size: Normal)   Pulse 89   Ht '5\' 3"'$  (1.6 m)   Wt 119 lb 12.8 oz (54.3 kg)   SpO2 96%   BMI 21.22 kg/m     Wt Readings from Last 3 Encounters:  03/13/22 119 lb 12.8 oz (54.3 kg)  03/02/22 118 lb (53.5 kg)  02/26/22 117 lb 6.4 oz (53.3 kg)     GEN:  Well nourished, well developed in no acute distress HEENT: Normal NECK: No JVD; No carotid bruits CARDIAC: RRR, no murmurs, rubs, gallops RESPIRATORY:  Clear to auscultation without rales, wheezing or rhonchi  ABDOMEN: Soft, non-tender, non-distended MUSCULOSKELETAL:  No edema; No deformity  SKIN: Warm and dry NEUROLOGIC:  Alert and oriented x 3 PSYCHIATRIC:  Normal affect   ASSESSMENT:    1. Coronary artery  disease involving native coronary artery of native heart without angina pectoris   2. Pure hypercholesterolemia   3. Smoking    PLAN:    In order of problems listed above:  CAD/coronary artery calcification in the LAD noted on chest CT.  Denies chest pain.  Continue aspirin, Lipitor to 80 mg daily.  Echo 8/22 EF 60 to 65%.  Hyperlipidemia, LDL not at goal.  Continue Lipitor to 80 mg daily.  Update fasting lipid profile.  If cholesterol not adequately controlled, plan to add Zetia. Current smoker, cessation again advised.  Follow-up in yearly   Medication Adjustments/Labs and Tests Ordered: Current medicines are reviewed at length with the patient today.  Concerns regarding medicines are outlined above.  Orders Placed This Encounter  Procedures   EKG 12-Lead     No orders of the defined types were placed in this encounter.    Patient Instructions  Medication Instructions:  Your physician recommends that you continue on your current medications as directed. Please refer to the Current Medication list given to you today.  *If you need a refill on your cardiac medications before your next appointment, please call your pharmacy*   Lab Work: -None ordered If you have labs (blood work) drawn today and your tests are completely normal, you will receive your results only by: [place] (if you have MyChart) OR A paper copy in the mail If you have any lab test that is abnormal or we need to change your treatment, we will call you to review the results.   Testing/Procedures: -None ordered   Follow-Up: At [facility], you and your health needs are our priority.  As part of our continuing mission to provide you with exceptional heart care, we have created designated Provider Care Teams.  These Care Teams include your primary Cardiologist (physician) and Advanced Practice Providers (APPs -  Physician Assistants and Nurse Practitioners) who all work together to provide you  with the care you need, when you need it.  We recommend signing up for the patient portal called "MyChart".  Sign up information is provided on this After Visit Summary.  MyChart is used to connect with patients for Virtual Visits (Telemedicine).  Patients are able to view lab/test results, encounter notes, upcoming appointments, etc.  Non-urgent messages can be sent to your provider as well.   To learn more about what you can do with MyChart, go to [email or web address].    Your next appointment:   1 year(s)  Provider:   You may see Kate Sable, MD or one of the following Advanced Practice Providers on your designated Care Team:   Murray Hodgkins, NP Christell Faith, PA-C Cadence Kathlen Mody, PA-C Gerrie Nordmann, NP  Other Instructions -None    Signed, Kate Sable, MD  03/13/2022 10:22 AM    [place]

## 2022-03-16 ENCOUNTER — Ambulatory Visit
Admission: RE | Admit: 2022-03-16 | Discharge: 2022-03-16 | Disposition: A | Discharge status: Home / Self Care | Payer: MEDICARE | Source: Ambulatory Visit | Attending: Physician Assistant | Admitting: Physician Assistant

## 2022-03-16 DIAGNOSIS — R413 Other amnesia: Secondary | ICD-10-CM | POA: Diagnosis not present

## 2022-03-16 DIAGNOSIS — J3489 Other specified disorders of nose and nasal sinuses: Secondary | ICD-10-CM | POA: Diagnosis not present

## 2022-03-17 ENCOUNTER — Telehealth: Payer: Self-pay | Admitting: Physician Assistant

## 2022-03-17 NOTE — Telephone Encounter (Signed)
Advised no places near [place] per Clarise Cruz, she thanked me for calling

## 2022-03-17 NOTE — Telephone Encounter (Signed)
Patient wants to know if there is a neuropsych that is closer to her home that she can have this testing done at. Please check on this and call patient and let her know. The patient asked that you speak with her

## 2022-03-18 ENCOUNTER — Telehealth: Payer: Self-pay

## 2022-03-18 NOTE — Telephone Encounter (Signed)
Patient is calling in wondering if she can stop taking the medication donepezil (ARICEPT) 5 MG tablet when her MRI showed she didn't have alzheimer's or memory issues.

## 2022-03-18 NOTE — Telephone Encounter (Signed)
I advised of patient MRS, voiced understanding and thanked me for calling

## 2022-03-18 NOTE — Telephone Encounter (Signed)
Pt called in staying that Dr. Kate Sable in [place] had prescribed her 2 different meds, and she was wondering if she can stop them because after her MRI it shows that she don't have Alzheimer's. She would like a call back regarding this matter either from [place] or her assistant. She's available '@[phone]'$ [phone].

## 2022-06-30 ENCOUNTER — Telehealth: Payer: Self-pay | Admitting: Nurse Practitioner

## 2022-06-30 NOTE — Telephone Encounter (Signed)
Pt daughter would like to be called concerning the pt

## 2022-07-01 ENCOUNTER — Telehealth: Payer: Self-pay | Admitting: Nurse Practitioner

## 2022-07-01 NOTE — Telephone Encounter (Signed)
Called and Spoke with Ms. Victorino Dike ( pts daughter) and she stated that she wants to protect her job and that she discussed with before about not allowing pt to drive. She states due to her having to drive pt to all of her appts she will need FMLA filled out so that she can take her to appts with out affecting her job.    Ms. Victorino Dike has also disclosed to me that she would liek to be the main contact for: appt reminders, labs, & etc. I have made her mobile number the main one as she is on pts DPR.   She stated she will be dropping off the forms sometime during her lunch,

## 2022-07-01 NOTE — Telephone Encounter (Signed)
Patient's daughter dropped off document FMLA, to be filled out by provider. Patient requested to send it via Call Patient to pick up within 7-days. Document is located in providers folder at front office.Please advise at  [phone], daughter Brittany Burch.

## 2022-07-08 ENCOUNTER — Telehealth: Payer: Self-pay

## 2022-07-08 NOTE — Telephone Encounter (Signed)
LMOM informing pts daughter that her FMLA paper work is ready for pick up.  The forms have been placed in in an envelope with Jennifer's name as well as the pts name.   Envelope has been placed in designated pick up area

## 2022-07-08 NOTE — Telephone Encounter (Signed)
LMOM for pts daughter Victorino Dike to pick up, forms have been placed in designated area.

## 2022-07-09 ENCOUNTER — Encounter: Payer: Self-pay | Admitting: Psychology

## 2022-07-09 ENCOUNTER — Ambulatory Visit (INDEPENDENT_AMBULATORY_CARE_PROVIDER_SITE_OTHER): Payer: MEDICARE | Admitting: Psychology

## 2022-07-09 ENCOUNTER — Ambulatory Visit: Payer: MEDICARE

## 2022-07-09 DIAGNOSIS — G309 Alzheimer's disease, unspecified: Secondary | ICD-10-CM

## 2022-07-09 DIAGNOSIS — F02A0 Dementia in other diseases classified elsewhere, mild, without behavioral disturbance, psychotic disturbance, mood disturbance, and anxiety: Secondary | ICD-10-CM

## 2022-07-09 DIAGNOSIS — R4189 Other symptoms and signs involving cognitive functions and awareness: Secondary | ICD-10-CM

## 2022-07-09 HISTORY — DX: Dementia in other diseases classified elsewhere, mild, without behavioral disturbance, psychotic disturbance, mood disturbance, and anxiety: F02.A0
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
## 2022-07-09 NOTE — Progress Notes (Signed)
   Psychometrician Note   Cognitive testing was administered to Brittany Burch by Shan Levans, B.S. (psychometrist) under the supervision of Dr. Newman Nickels, Ph.D., licensed psychologist on 07/09/2022. Ms. Gambrill did not appear overtly distressed by the testing session per behavioral observation or responses across self-report questionnaires. Rest breaks were offered.    The battery of tests administered was selected by Dr. Newman Nickels, Ph.D. with consideration to Brittany Burch's current level of functioning, the nature of her symptoms, emotional and behavioral responses during interview, level of literacy, observed level of motivation/effort, and the nature of the referral question. This battery was communicated to the psychometrist. Communication between Dr. Newman Nickels, Ph.D. and the psychometrist was ongoing throughout the evaluation and Dr. Newman Nickels, Ph.D. was immediately accessible at all times. Dr. Newman Nickels, Ph.D. provided supervision to the psychometrist on the date of this service to the extent necessary to assure the quality of all services provided.    Brittany Burch will return within approximately 1-2 weeks for an interactive feedback session with Dr. Milbert Coulter at which time her test performances, clinical impressions, and treatment recommendations will be reviewed in detail. Brittany Burch understands she can contact our office should she require our assistance before this time.  A total of 120 minutes of billable time were spent face-to-face with Brittany Burch by the psychometrist. This includes both test administration and scoring time. Billing for these services is reflected in the clinical report generated by Dr. Newman Nickels, Ph.D.  This note reflects time spent with the psychometrician and does not include test scores or any clinical interpretations made by Dr. Milbert Coulter. The full report will follow in a separate note.

## 2022-07-15 ENCOUNTER — Ambulatory Visit (INDEPENDENT_AMBULATORY_CARE_PROVIDER_SITE_OTHER): Payer: MEDICARE | Admitting: Psychology

## 2022-07-15 DIAGNOSIS — G309 Alzheimer's disease, unspecified: Secondary | ICD-10-CM

## 2022-07-15 DIAGNOSIS — F02A0 Dementia in other diseases classified elsewhere, mild, without behavioral disturbance, psychotic disturbance, mood disturbance, and anxiety: Secondary | ICD-10-CM

## 2022-07-15 NOTE — Progress Notes (Signed)
   Neuropsychology Feedback Session [name]. [facility] [place] Department of Neurology  Reason for Referral:   Brittany Burch is a 72 y.o. right-handed Caucasian female referred by Marlowe Kays, PA-C, to characterize her current cognitive functioning and assist with diagnostic clarity and treatment planning in the context of subjective cognitive decline.   Feedback:   [name] completed a comprehensive neuropsychological evaluation on 07/09/2022. Please refer to that encounter for the full report and recommendations. Briefly, results suggested severe impairment surrounding essentially all aspects of learning and memory. Additional weaknesses were exhibited across receptive language (driven more by memory impairment rather than comprehension difficulties), phonemic fluency, and visuospatial abilities. Variability was exhibited across executive functioning and confrontation naming. The cause for ongoing impairment is somewhat unclear. With that being said, I do have elevated concerns surrounding the presence of an underlying neurodegenerative illness, namely Alzheimer's disease (i.e., a mild dementia presentation due to Alzheimer's disease). Across memory testing, [name] did not benefit from repeated exposure to novel information. After a brief delay, she was fully amnestic (i.e., 0% retention) across both verbal memory tasks and only exhibited 14% retention across a visual memory task. While she did show some benefit from cueing across a list-based recognition task, her performance was very poor across story and figure-based tasks. Taken together, this suggests the presence of rapid forgetting and an evolving and already fairly significant storage impairment, both of which represent the hallmark testing patterns of Alzheimer's disease. Further weakness/variability surrounding confrontation naming, executive functioning, and visuospatial abilities would follow typical disease  progression. Intact semantic fluency is encouraging.   [name] was accompanied by her daughter during the current feedback session. Content of the current session focused on the results of her neuropsychological evaluation. [name] was given the opportunity to ask questions and her questions were answered. She was encouraged to reach out should additional questions arise. A copy of her report was provided at the conclusion of the visit.      One unit [id number] was billed for Dr. Tammy Sours time spent preparing for, conducting, and documenting the current feedback session with [name].

## 2022-07-17 ENCOUNTER — Institutional Professional Consult (permissible substitution): Payer: MEDICARE | Admitting: Psychology

## 2022-07-17 ENCOUNTER — Ambulatory Visit: Payer: Self-pay

## 2022-07-22 ENCOUNTER — Encounter: Payer: Self-pay | Admitting: Nurse Practitioner

## 2022-07-22 ENCOUNTER — Other Ambulatory Visit: Payer: Self-pay | Admitting: Nurse Practitioner

## 2022-07-22 DIAGNOSIS — Z1231 Encounter for screening mammogram for malignant neoplasm of breast: Secondary | ICD-10-CM

## 2022-08-06 ENCOUNTER — Encounter: Payer: MEDICARE | Admitting: Psychology

## 2022-08-12 ENCOUNTER — Encounter (INDEPENDENT_AMBULATORY_CARE_PROVIDER_SITE_OTHER): Payer: Self-pay

## 2022-08-17 ENCOUNTER — Ambulatory Visit
Admission: RE | Admit: 2022-08-17 | Discharge: 2022-08-17 | Disposition: A | Discharge status: Home / Self Care | Payer: MEDICARE | Source: Ambulatory Visit | Attending: Nurse Practitioner | Admitting: Nurse Practitioner

## 2022-08-17 DIAGNOSIS — Z1231 Encounter for screening mammogram for malignant neoplasm of breast: Secondary | ICD-10-CM | POA: Diagnosis present

## 2022-08-21 ENCOUNTER — Ambulatory Visit: Payer: MEDICARE | Admitting: Nurse Practitioner

## 2022-08-25 ENCOUNTER — Ambulatory Visit (INDEPENDENT_AMBULATORY_CARE_PROVIDER_SITE_OTHER): Payer: MEDICARE | Admitting: Nurse Practitioner

## 2022-08-25 ENCOUNTER — Encounter: Payer: Self-pay | Admitting: Nurse Practitioner

## 2022-08-25 VITALS — BP 120/62 | HR 69 | Temp 97.7°F | Ht 63.0 in | Wt 114.2 lb

## 2022-08-25 DIAGNOSIS — I2510 Atherosclerotic heart disease of native coronary artery without angina pectoris: Secondary | ICD-10-CM | POA: Diagnosis not present

## 2022-08-25 DIAGNOSIS — F02A0 Dementia in other diseases classified elsewhere, mild, without behavioral disturbance, psychotic disturbance, mood disturbance, and anxiety: Secondary | ICD-10-CM

## 2022-08-25 DIAGNOSIS — E559 Vitamin D deficiency, unspecified: Secondary | ICD-10-CM | POA: Diagnosis not present

## 2022-08-25 DIAGNOSIS — E7800 Pure hypercholesterolemia, unspecified: Secondary | ICD-10-CM | POA: Diagnosis not present

## 2022-08-25 DIAGNOSIS — J439 Emphysema, unspecified: Secondary | ICD-10-CM

## 2022-08-25 DIAGNOSIS — G309 Alzheimer's disease, unspecified: Secondary | ICD-10-CM | POA: Diagnosis not present

## 2022-08-25 LAB — COMPREHENSIVE METABOLIC PANEL
ALT: 12 U/L (ref 0–35)
AST: 18 U/L (ref 0–37)
Albumin: 4.3 g/dL (ref 3.5–5.2)
Alkaline Phosphatase: 81 U/L (ref 39–117)
BUN: 18 mg/dL (ref 6–23)
CO2: 28 mEq/L (ref 19–32)
Calcium: 9.7 mg/dL (ref 8.4–10.5)
Chloride: 101 mEq/L (ref 96–112)
Creatinine, Ser: 0.73 mg/dL (ref 0.40–1.20)
GFR: 81.56 mL/min (ref >60.00)
Glucose, Bld: 110 mg/dL — ABNORMAL HIGH (ref 70–99)
Potassium: 4.3 mEq/L (ref 3.5–5.1)
Sodium: 139 mEq/L (ref 135–145)
Total Bilirubin: 0.4 mg/dL (ref 0.2–1.2)
Total Protein: 6.9 g/dL (ref 6.0–8.3)

## 2022-08-25 LAB — CBC WITH DIFFERENTIAL/PLATELET
Basophils Absolute: 0.1 10*3/uL (ref 0.0–0.1)
Basophils Relative: 0.7 % (ref 0.0–3.0)
Eosinophils Absolute: 0.1 10*3/uL (ref 0.0–0.7)
Eosinophils Relative: 1.4 % (ref 0.0–5.0)
HCT: 42.5 % (ref 36.0–46.0)
Hemoglobin: 13.8 g/dL (ref 12.0–15.0)
Lymphocytes Relative: 28.3 % (ref 12.0–46.0)
Lymphs Abs: 2.1 10*3/uL (ref 0.7–4.0)
MCHC: 32.4 g/dL (ref 30.0–36.0)
MCV: 95.6 fl (ref 78.0–100.0)
Monocytes Absolute: 0.6 10*3/uL (ref 0.1–1.0)
Monocytes Relative: 8.3 % (ref 3.0–12.0)
Neutro Abs: 4.6 10*3/uL (ref 1.4–7.7)
Neutrophils Relative %: 61.3 % (ref 43.0–77.0)
Platelets: 279 10*3/uL (ref 150.0–400.0)
RBC: 4.44 Mil/uL (ref 3.87–5.11)
RDW: 13.7 % (ref 11.5–15.5)
WBC: 7.5 10*3/uL (ref 4.0–10.5)

## 2022-08-25 LAB — LIPID PANEL
Cholesterol: 146 mg/dL (ref 0–200)
HDL: 50.6 mg/dL (ref >39.00)
LDL Cholesterol: 74 mg/dL (ref 0–99)
NonHDL: 95.36
Total CHOL/HDL Ratio: 3
Triglycerides: 109 mg/dL (ref 0.0–149.0)
VLDL: 21.8 mg/dL (ref 0.0–40.0)

## 2022-08-25 LAB — VITAMIN D 25 HYDROXY (VIT D DEFICIENCY, FRACTURES): VITD: 85.38 ng/mL (ref 30.00–100.00)

## 2022-08-25 NOTE — Progress Notes (Signed)
Bethanie Dicker, NP-C Phone: [phone]  Brittany Burch is a 72 y.o. female who presents today for follow up.   Alzheimer's Dementia- Recently diagnosed by Neurology. Started on Aricept 5 mg daily. No longer driving. Has caretaker with her.   HYPERLIPIDEMIA Symptoms Chest pain on exertion:  No   Leg claudication:   No Medications: Compliance- Lipitor and Zetia  Right upper quadrant pain- No  Muscle aches- No Lipid Panel     Component Value Date/Time   CHOL 177 02/20/2022 0917   CHOL 175 08/19/2020 0817   TRIG 107.0 02/20/2022 0917   HDL 53.60 02/20/2022 0917   HDL 51 08/19/2020 0817   CHOLHDL 3 02/20/2022 0917   VLDL 21.4 02/20/2022 0917   LDLCALC 102 (H) 02/20/2022 0917   LDLCALC 101 (H) 08/19/2020 0817   LDLCALC 135 (H) 04/14/2017 0822   LABVLDL 23 08/19/2020 0817   COPD: Medication compliance- Trelegy  Rescue inhaler use- Rarely Dyspnea- No  Wheezing- No  Cough- Occasionally  Productive- No  Social History   Tobacco Use  Smoking Status Every Day   Current packs/day: 0.50   Average packs/day: 0.5 packs/day for 46.0 years (23.0 ttl pk-yrs)   Types: Cigarettes  Smokeless Tobacco Never  Tobacco Comments   only smokes a few a day now    Current Outpatient Medications on File Prior to Visit  Medication Sig Dispense Refill   albuterol (VENTOLIN HFA) 108 (90 Base) MCG/ACT inhaler Inhale 1-2 puffs into the lungs every 4 (four) hours as needed for wheezing or shortness of breath. 54 g 3   aspirin EC 81 MG tablet Take 81 mg by mouth daily.     atorvastatin (LIPITOR) 80 MG tablet Take 1 tablet (80 mg total) by mouth daily at 6 PM. 90 tablet 3   donepezil (ARICEPT) 5 MG tablet Take 1 tablet (5 mg total) by mouth daily. 90 tablet 1   ezetimibe (ZETIA) 10 MG tablet Take 1 tablet (10 mg total) by mouth daily. 90 tablet 3   Fluticasone-Umeclidin-Vilant (TRELEGY ELLIPTA) 100-62.5-25 MCG/ACT AEPB Inhale 1 puff into the lungs daily. 180 each 5   No current facility-administered  medications on file prior to visit.     ROS see history of present illness  Objective  Physical Exam Vitals:   08/25/22 0818  BP: 120/62  Pulse: 69  Temp: 97.7 F (36.5 C)  SpO2: 97%    BP Readings from Last 3 Encounters:  08/25/22 120/62  03/13/22 122/72  03/02/22 128/64   Wt Readings from Last 3 Encounters:  08/25/22 114 lb 3.2 oz (51.8 kg)  03/13/22 119 lb 12.8 oz (54.3 kg)  03/02/22 118 lb (53.5 kg)    Physical Exam Constitutional:      General: She is not in acute distress.    Appearance: Normal appearance.  HENT:     Head: Normocephalic.  Cardiovascular:     Rate and Rhythm: Normal rate and regular rhythm.     Heart sounds: Normal heart sounds.  Pulmonary:     Effort: Pulmonary effort is normal.     Breath sounds: Normal breath sounds. No wheezing.  Skin:    General: Skin is warm and dry.  Neurological:     General: No focal deficit present.     Mental Status: She is alert.  Psychiatric:        Mood and Affect: Mood normal.        Behavior: Behavior normal.    Assessment/Plan: Please see individual problem list.  Pulmonary  emphysema, unspecified emphysema type (HCC) Assessment & Plan: Chronic. Stable on Trelegy and Albuterol inhalers. Continue. Encouraged to use Trelegy daily as directed. Rarely using albuterol, unless sick. Will continue to monitor.    Pure hypercholesterolemia Assessment & Plan: Chronic. Continue Zetia 10 mg daily and Lipitor 80 mg daily. Will check lipids today. Encouraged healthy diet and exercise.   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 Continue Aricept 5 mg daily. Has caretaker at home. Follow up with Neuro as scheduled.    Coronary artery disease involving native coronary artery of native heart without angina pectoris Assessment  & Plan: Chronic. Continue ASA 81 mg daily, Lipitor 80 mg daily and Zetia 10 mg daily. Follow up with Cardiology as scheduled.   Orders: -     CBC with Differential/Platelet -     Comprehensive metabolic panel  Vitamin D deficiency Assessment & Plan: Chronic. Not taking a daily supplement. Will check vitamin D level today.   Orders: -     VITAMIN D 25 Hydroxy (Vit-D Deficiency, Fractures)    Return in about 6 months (around 02/25/2023) for Follow up.   Bethanie Dicker, NP-C [facility] [employer]

## 2022-08-25 NOTE — Assessment & Plan Note (Addendum)
Newly diagnosed by Neurology. Continue Aricept 5 mg daily. Has caretaker at home. Follow up with Neuro as scheduled.

## 2022-08-25 NOTE — Assessment & Plan Note (Signed)
Chronic. Continue Zetia 10 mg daily and Lipitor 80 mg daily. Will check lipids today. Encouraged healthy diet and exercise.

## 2022-08-25 NOTE — Assessment & Plan Note (Signed)
Chronic. Continue ASA 81 mg daily, Lipitor 80 mg daily and Zetia 10 mg daily. Follow up with Cardiology as scheduled.

## 2022-08-25 NOTE — Assessment & Plan Note (Signed)
Chronic. Stable on Trelegy and Albuterol inhalers. Continue. Encouraged to use Trelegy daily as directed. Rarely using albuterol, unless sick. Will continue to monitor.

## 2022-08-25 NOTE — Assessment & Plan Note (Signed)
Chronic. Not taking a daily supplement. Will check vitamin D level today.

## 2022-09-07 ENCOUNTER — Ambulatory Visit: Payer: MEDICARE | Admitting: Physician Assistant

## 2022-10-20 ENCOUNTER — Encounter: Payer: Self-pay | Admitting: Physician Assistant

## 2022-10-20 ENCOUNTER — Ambulatory Visit (INDEPENDENT_AMBULATORY_CARE_PROVIDER_SITE_OTHER): Payer: MEDICARE | Admitting: Physician Assistant

## 2022-10-20 VITALS — BP 100/65 | HR 68 | Resp 20 | Ht 63.0 in | Wt 114.0 lb

## 2022-10-20 DIAGNOSIS — F02A0 Dementia in other diseases classified elsewhere, mild, without behavioral disturbance, psychotic disturbance, mood disturbance, and anxiety: Secondary | ICD-10-CM

## 2022-10-20 DIAGNOSIS — G309 Alzheimer's disease, unspecified: Secondary | ICD-10-CM

## 2022-10-20 MED ORDER — DONEPEZIL HCL 5 MG PO TABS
5.0000 mg | ORAL_TABLET | Freq: Every day | ORAL | 3 refills | Status: DC
Start: 2022-10-20 — End: 2023-04-20

## 2022-10-20 NOTE — Patient Instructions (Addendum)
It was a pleasure to see you today at our office.   Recommendations:  Continue Donepezil 5 mg daily. Side effects discussed   Follow up in 6 months  Whom to call:  Memory  decline, memory medications: Call our office [phone]   For psychiatric meds, mood meds: Please have your primary care physician manage these medications.   Counseling regarding caregiver distress, including caregiver depression, anxiety and issues regarding community resources, adult day care programs, adult living facilities, or memory care questions:   Feel free to contact Misty Lisabeth Register, Social Worker at [phone]   For assessment of decision of mental capacity and competency:  Call Dr. Erick Blinks, geriatric psychiatrist at [phone]  For guidance in geriatric dementia issues please call [facility] [phone]  For guidance regarding [facility] and if placement were needed at the facility, contact Sidney Ace, Social Worker tel: [phone]  If you have any severe symptoms of a stroke, or other severe issues such as confusion,severe chills or fever, etc call 911 or go to the ER as you may need to be evaluated further   Feel free to visit Facebook page " [email or web address]" for tips of how to care for people with memory problems.      RECOMMENDATIONS FOR ALL PATIENTS WITH MEMORY PROBLEMS: 1. Continue to exercise (Recommend 30 minutes of walking everyday, or 3 hours every week) 2. Increase social interactions - continue going to [place] and enjoy social gatherings with friends and family 3. Eat healthy, avoid fried foods and eat more fruits and vegetables 4. Maintain adequate blood pressure, blood sugar, and blood cholesterol level. Reducing the risk of stroke and cardiovascular disease also helps promoting better memory. 5. Avoid stressful situations. Live a simple life and avoid aggravations. Organize your time and prepare for the next day in anticipation. 6. Sleep  well, avoid any interruptions of sleep and avoid any distractions in the bedroom that may interfere with adequate sleep quality 7. Avoid sugar, avoid sweets as there is a strong link between excessive sugar intake, diabetes, and cognitive impairment We discussed the Mediterranean diet, which has been shown to help patients reduce the risk of progressive memory disorders and reduces cardiovascular risk. This includes eating fish, eat fruits and green leafy vegetables, nuts like almonds and hazelnuts, walnuts, and also use olive oil. Avoid fast foods and fried foods as much as possible. Avoid sweets and sugar as sugar use has been linked to worsening of memory function.  There is always a concern of gradual progression of memory problems. If this is the case, then we may need to adjust level of care according to patient needs. Support, both to the patient and caregiver, should then be put into place.      You have been referred for a neuropsychological evaluation (i.e., evaluation of memory and thinking abilities). Please bring someone with you to this appointment if possible, as it is helpful for the doctor to hear from both you and another adult who knows you well. Please bring eyeglasses and hearing aids if you wear them.    The evaluation will take approximately 3 hours and has two parts:   The first part is a clinical interview with the neuropsychologist (Dr. Milbert Coulter or Dr. Roseanne Reno). During the interview, the neuropsychologist will speak with you and the individual you brought to the appointment.    The second part of the evaluation is testing with the doctor's technician Annabelle Harman or Selena Batten). During the testing, the technician will  ask you to remember different types of material, solve problems, and answer some questionnaires. Your family member will not be present for this portion of the evaluation.   Please note: We must reserve several hours of the neuropsychologist's time and the psychometrician's  time for your evaluation appointment. As such, there is a No-Show fee of $100. If you are unable to attend any of your appointments, please contact our office as soon as possible to reschedule.    FALL PRECAUTIONS: Be cautious when walking. Scan the area for obstacles that may increase the risk of trips and falls. When getting up in the mornings, sit up at the edge of the bed for a few minutes before getting out of bed. Consider elevating the bed at the head end to avoid drop of blood pressure when getting up. Walk always in a well-lit room (use night lights in the walls). Avoid area rugs or power cords from appliances in the middle of the walkways. Use a walker or a cane if necessary and consider physical therapy for balance exercise. Get your eyesight checked regularly.  FINANCIAL OVERSIGHT: Supervision, especially oversight when making financial decisions or transactions is also recommended.  HOME SAFETY: Consider the safety of the kitchen when operating appliances like stoves, microwave oven, and blender. Consider having supervision and share cooking responsibilities until no longer able to participate in those. Accidents with firearms and other hazards in the house should be identified and addressed as well.   ABILITY TO BE LEFT ALONE: If patient is unable to contact 911 operator, consider using LifeLine, or when the need is there, arrange for someone to stay with patients. Smoking is a fire hazard, consider supervision or cessation. Risk of wandering should be assessed by caregiver and if detected at any point, supervision and safe proof recommendations should be instituted.  MEDICATION SUPERVISION: Inability to self-administer medication needs to be constantly addressed. Implement a mechanism to ensure safe administration of the medications.   DRIVING: Regarding driving, in patients with progressive memory problems, driving will be impaired. We advise to have someone else do the driving if  trouble finding directions or if minor accidents are reported. Independent driving assessment is available to determine safety of driving.   If you are interested in the driving assessment, you can contact the following:  [employer] in [place] [phone]  [facility] [phone]  [facility] [phone] or [phone]    Mediterranean Diet A Mediterranean diet refers to food and lifestyle choices that are based on the traditions of countries located on the [employer]. This way of eating has been shown to help prevent certain conditions and improve outcomes for people who have chronic diseases, like kidney disease and heart disease. What are tips for following this plan? Lifestyle  Cook and eat meals together with your family, when possible. Drink enough fluid to keep your urine clear or pale yellow. Be physically active every day. This includes: Aerobic exercise like running or swimming. Leisure activities like gardening, walking, or housework. Get 7-8 hours of sleep each night. If recommended by your health care provider, drink red wine in moderation. This means 1 glass a day for nonpregnant women and 2 glasses a day for men. A glass of wine equals 5 oz (150 mL). Reading food labels  Check the serving size of packaged foods. For foods such as rice and pasta, the serving size refers to the amount of cooked product, not dry. Check the total  fat in packaged foods. Avoid foods that have saturated fat or trans fats. Check the ingredients list for added sugars, such as corn syrup. Shopping  At the grocery store, buy most of your food from the areas near the walls of the store. This includes: Fresh fruits and vegetables (produce). Grains, beans, nuts, and seeds. Some of these may be available in unpackaged forms or large amounts (in bulk). Fresh seafood. Poultry and eggs. Low-fat dairy  products. Buy whole ingredients instead of prepackaged foods. Buy fresh fruits and vegetables in-season from local farmers markets. Buy frozen fruits and vegetables in resealable bags. If you do not have access to quality fresh seafood, buy precooked frozen shrimp or canned fish, such as tuna, salmon, or sardines. Buy small amounts of raw or cooked vegetables, salads, or olives from the deli or salad bar at your store. Stock your pantry so you always have certain foods on hand, such as olive oil, canned tuna, canned tomatoes, rice, pasta, and beans. Cooking  Cook foods with extra-virgin olive oil instead of using butter or other vegetable oils. Have meat as a side dish, and have vegetables or grains as your main dish. This means having meat in small portions or adding small amounts of meat to foods like pasta or stew. Use beans or vegetables instead of meat in common dishes like chili or lasagna. Experiment with different cooking methods. Try roasting or broiling vegetables instead of steaming or sauteing them. Add frozen vegetables to soups, stews, pasta, or rice. Add nuts or seeds for added healthy fat at each meal. You can add these to yogurt, salads, or vegetable dishes. Marinate fish or vegetables using olive oil, lemon juice, garlic, and fresh herbs. Meal planning  Plan to eat 1 vegetarian meal one day each week. Try to work up to 2 vegetarian meals, if possible. Eat seafood 2 or more times a week. Have healthy snacks readily available, such as: Vegetable sticks with hummus. Greek yogurt. Fruit and nut trail mix. Eat balanced meals throughout the week. This includes: Fruit: 2-3 servings a day Vegetables: 4-5 servings a day Low-fat dairy: 2 servings a day Fish, poultry, or lean meat: 1 serving a day Beans and legumes: 2 or more servings a week Nuts and seeds: 1-2 servings a day Whole grains: 6-8 servings a day Extra-virgin olive oil: 3-4 servings a day Limit red meat and sweets  to only a few servings a month What are my food choices? Mediterranean diet Recommended Grains: Whole-grain pasta. Brown rice. Bulgar wheat. Polenta. Couscous. Whole-wheat bread. Orpah Cobb. Vegetables: Artichokes. Beets. Broccoli. Cabbage. Carrots. Eggplant. Green beans. Chard. Kale. Spinach. Onions. Leeks. Peas. Squash. Tomatoes. Peppers. Radishes. Fruits: Apples. Apricots. Avocado. Berries. Bananas. Cherries. Dates. Figs. Grapes. Lemons. Melon. Oranges. Peaches. Plums. Pomegranate. Meats and other protein foods: Beans. Almonds. Sunflower seeds. Pine nuts. Peanuts. Cod. Salmon. Scallops. Shrimp. Tuna. Tilapia. Clams. Oysters. Eggs. Dairy: Low-fat milk. Cheese. Greek yogurt. Beverages: Water. Red wine. Herbal tea. Fats and oils: Extra virgin olive oil. Avocado oil. Grape seed oil. Sweets and desserts: [place] yogurt with honey. Baked apples. Poached pears. Trail mix. Seasoning and other foods: Basil. Cilantro. Coriander. Cumin. Mint. Parsley. Sage. Rosemary. Tarragon. Garlic. Oregano. Thyme. Pepper. Balsalmic vinegar. Tahini. Hummus. Tomato sauce. Olives. Mushrooms. Limit these Grains: Prepackaged pasta or rice dishes. Prepackaged cereal with added sugar. Vegetables: Deep fried potatoes (french fries). Fruits: Fruit canned in syrup. Meats and other protein foods: Beef. Pork. Lamb. Poultry with skin. Hot dogs. Tomasa Blase. Dairy: Ice cream. Sour cream. Whole  milk. Beverages: Juice. Sugar-sweetened soft drinks. Beer. Liquor and spirits. Fats and oils: Butter. Canola oil. Vegetable oil. Beef fat (tallow). Lard. Sweets and desserts: Cookies. Cakes. Pies. Candy. Seasoning and other foods: Mayonnaise. Premade sauces and marinades. The items listed may not be a complete list. Talk with your dietitian about what dietary choices are right for you. Summary The Mediterranean diet includes both food and lifestyle choices. Eat a variety of fresh fruits and vegetables, beans, nuts, seeds, and whole  grains. Limit the amount of red meat and sweets that you eat. Talk with your health care provider about whether it is safe for you to drink red wine in moderation. This means 1 glass a day for nonpregnant women and 2 glasses a day for men. A glass of wine equals 5 oz (150 mL). This information is not intended to replace advice given to you by your health care provider. Make sure you discuss any questions you have with your health care provider. Document Released: 08/22/2015 Document Revised: 09/24/2015 Document Reviewed: 08/22/2015 Elsevier Interactive Patient Education  2017 [employer].
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
## 2023-01-04 ENCOUNTER — Ambulatory Visit: Payer: MEDICARE

## 2023-01-08 ENCOUNTER — Ambulatory Visit
Admission: RE | Admit: 2023-01-08 | Discharge: 2023-01-08 | Disposition: A | Discharge status: Home / Self Care | Payer: MEDICARE | Source: Ambulatory Visit | Attending: Acute Care | Admitting: Acute Care

## 2023-01-08 DIAGNOSIS — Z87891 Personal history of nicotine dependence: Secondary | ICD-10-CM | POA: Insufficient documentation

## 2023-01-08 DIAGNOSIS — F17210 Nicotine dependence, cigarettes, uncomplicated: Secondary | ICD-10-CM | POA: Insufficient documentation

## 2023-01-21 ENCOUNTER — Other Ambulatory Visit: Payer: Self-pay | Admitting: Acute Care

## 2023-01-21 DIAGNOSIS — F17210 Nicotine dependence, cigarettes, uncomplicated: Secondary | ICD-10-CM

## 2023-01-21 DIAGNOSIS — Z87891 Personal history of nicotine dependence: Secondary | ICD-10-CM

## 2023-01-21 DIAGNOSIS — Z122 Encounter for screening for malignant neoplasm of respiratory organs: Secondary | ICD-10-CM

## 2023-01-29 ENCOUNTER — Encounter: Payer: Self-pay | Admitting: Nurse Practitioner

## 2023-01-29 ENCOUNTER — Ambulatory Visit (INDEPENDENT_AMBULATORY_CARE_PROVIDER_SITE_OTHER): Payer: MEDICARE | Admitting: Nurse Practitioner

## 2023-01-29 VITALS — BP 116/74 | HR 94 | Temp 98.1°F | Ht 63.0 in | Wt 112.4 lb

## 2023-01-29 DIAGNOSIS — R238 Other skin changes: Secondary | ICD-10-CM

## 2023-01-29 MED ORDER — MUPIROCIN 2 % EX OINT: 1.0000 | TOPICAL_OINTMENT | Freq: Two times a day (BID) | CUTANEOUS | 0 refills | Status: DC

## 2023-01-29 NOTE — Progress Notes (Unsigned)
Established Patient Office Visit  Subjective:  Patient ID: Brittany Burch, female    DOB: 1950-05-31  Age: 73 y.o. MRN: [id number]  CC:  Chief Complaint  Patient presents with   Acute Visit    Sore at nasal bridge on right side.   Discussed the use of a AI scribe  software for clinical note transcription with the patient, who gave verbal consent to proceed.  HPI  Brittany Burch presents with a skin irritation on the bridge of the nose, where her glasses sit.  She is accompanied by her daughter. The irritation, which started around Monday or Tuesday, was initially red and slightly swollen. The patient self-medicated with unknown eye drops and cream, applying it to the affected area but not in the eye. The irritation has since improved and is no longer red or swollen. The patient denies any itching or irritation at present. She also denies any recent trauma to the area.  HPI   Past Medical History:  Diagnosis Date   Actinic keratoses 11/17/2017   Allergic rhinitis 02/16/2017   Aortic atherosclerosis 06/02/2019   CAD (coronary artery disease) 11/17/2017   COPD (chronic obstructive pulmonary disease) 11/17/2017   Moderate ct chest 05/31/20   Hip pain 02/16/2017   History of cervical cancer    HLD (hyperlipidemia) 02/16/2017   Mild Alzheimer's dementia 07/09/2022   Tobacco use 11/17/2017   Urinary incontinence 02/16/2017   Vitamin D deficiency 11/17/2017    Past Surgical History:  Procedure Laterality Date   ABDOMINAL HYSTERECTOMY     1983   COLONOSCOPY WITH PROPOFOL N/A 12/31/2017   Procedure: COLONOSCOPY WITH PROPOFOL;  Surgeon: Pasty Spillers, MD;  Location: [facility] ENDOSCOPY;  Service: Endoscopy;  Laterality: N/A;    Family History  Problem Relation Age of Onset   Cancer Mother        lung cancer smoker died 49   Muscular dystrophy Father    Muscular dystrophy Sister    Muscular dystrophy Sister    Breast cancer Neg Hx     Social History    Socioeconomic History   Marital status: Widowed    Spouse name: Not on file   Number of children: 2   Years of education: 12   Highest education level: High school graduate  Occupational History   Occupation: Retired  Tobacco Use   Smoking status: Every Day    Current packs/day: 0.50    Average packs/day: 0.5 packs/day for 46.0 years (23.0 ttl pk-yrs)    Types: Cigarettes   Smokeless tobacco: Never   Tobacco comments:    only smokes a few a day now  Vaping Use   Vaping status: Never Used  Substance and Sexual Activity   Alcohol use: No   Drug use: No   Sexual activity: Never  Other Topics Concern   Not on file  Social History Narrative   Widowed    12 grade education    Lives with daughter   Retired   Right handed   One floor home   Social Drivers of Health   Financial Resource Strain: Low Risk  (02/26/2022)   Overall Financial Resource Strain (CARDIA)    Difficulty of Paying Living Expenses: Not hard at all  Food Insecurity: No Food Insecurity (02/26/2022)   Hunger Vital Sign    Worried About Running Out of Food in the Last Year: Never true    Ran Out of Food in the Last Year: Never true  Transportation Needs: No Transportation Needs (  02/26/2022)   PRAPARE - Administrator, Civil Service (Medical): No    Lack of Transportation (Non-Medical): No  Physical Activity: Sufficiently Active (02/26/2022)   Exercise Vital Sign    Days of Exercise per Week: 7 days    Minutes of Exercise per Session: 60 min  Stress: No Stress Concern Present (02/26/2022)   [employer] of Occupational Health - Occupational Stress Questionnaire    Feeling of Stress : Not at all  Social Connections: Moderately Isolated (02/26/2022)   Social Connection and Isolation Panel [NHANES]    Frequency of Communication with Friends and Family: More than three times a week    Frequency of Social Gatherings with Friends and Family: More than three times a week    Attends Religious  Services: More than 4 times per year    Active Member of [employer] or Organizations: No    Attends Banker Meetings: Never    Marital Status: Widowed  Intimate Partner Violence: Not At Risk (01/15/2021)   Humiliation, Afraid, Rape, and Kick questionnaire    Fear of Current or Ex-Partner: No    Emotionally Abused: No    Physically Abused: No    Sexually Abused: No     Outpatient Medications Prior to Visit  Medication Sig Dispense Refill   albuterol (VENTOLIN HFA) 108 (90 Base) MCG/ACT inhaler Inhale 1-2 puffs into the lungs every 4 (four) hours as needed for wheezing or shortness of breath. 54 g 3   aspirin EC 81 MG tablet Take 81 mg by mouth daily.     atorvastatin (LIPITOR) 80 MG tablet Take 1 tablet (80 mg total) by mouth daily at 6 PM. 90 tablet 3   donepezil (ARICEPT) 5 MG tablet Take 1 tablet (5 mg total) by mouth daily. 90 tablet 3   ezetimibe (ZETIA) 10 MG tablet Take 1 tablet (10 mg total) by mouth daily. 90 tablet 3   Fluticasone-Umeclidin-Vilant (TRELEGY ELLIPTA) 100-62.5-25 MCG/ACT AEPB Inhale 1 puff into the lungs daily. 180 each 5   No facility-administered medications prior to visit.    No Known Allergies  ROS Review of Systems Negative unless indicated in HPI.    Objective:    Physical Exam    BP 116/74   Pulse 94   Temp 98.1 F (36.7 C)   Ht 5\' 3"  (1.6 m)   Wt 112 lb 6.4 oz (51 kg)   SpO2 96%   BMI 19.91 kg/m  Wt Readings from Last 3 Encounters:  01/29/23 112 lb 6.4 oz (51 kg)  10/20/22 114 lb (51.7 kg)  08/25/22 114 lb 3.2 oz (51.8 kg)     Health Maintenance  Topic Date Due   Medicare Annual Wellness (AWV)  02/27/2023   COVID-19 Vaccine (4 - 2024-25 season) 02/13/2023 (Originally 09/13/2022)   INFLUENZA VACCINE  04/12/2023 (Originally 08/13/2022)   Lung Cancer Screening  01/08/2024   MAMMOGRAM  08/16/2024   Colonoscopy  01/01/2028   DTaP/Tdap/Td (2 - Td or Tdap) 05/16/2028   Pneumonia Vaccine 58+ Years old  Completed   DEXA SCAN   Completed   Hepatitis C Screening  Completed   Zoster Vaccines- Shingrix  Completed   HPV VACCINES  Aged Out    There are no preventive care reminders to display for this patient.  Lab Results  Component Value Date   TSH 1.09 02/20/2022   Lab Results  Component Value Date   WBC 7.5 08/25/2022   HGB 13.8 08/25/2022   HCT 42.5 08/25/2022  MCV 95.6 08/25/2022   PLT 279.0 08/25/2022   Lab Results  Component Value Date   NA 139 08/25/2022   K 4.3 08/25/2022   CO2 28 08/25/2022   GLUCOSE 110 (H) 08/25/2022   BUN 18 08/25/2022   CREATININE 0.73 08/25/2022   BILITOT 0.4 08/25/2022   ALKPHOS 81 08/25/2022   AST 18 08/25/2022   ALT 12 08/25/2022   PROT 6.9 08/25/2022   ALBUMIN 4.3 08/25/2022   CALCIUM 9.7 08/25/2022   GFR 81.56 08/25/2022   Lab Results  Component Value Date   CHOL 146 08/25/2022   Lab Results  Component Value Date   HDL 50.60 08/25/2022   Lab Results  Component Value Date   LDLCALC 74 08/25/2022   Lab Results  Component Value Date   TRIG 109.0 08/25/2022   Lab Results  Component Value Date   CHOLHDL 3 08/25/2022   No results found for: "HGBA1C"    Assessment & Plan:  Skin irritation Assessment & Plan: Likely due to glasses rubbing against skin. Patient has been self-medicating with unknown eye drops and cream. -Prescribe Bactroban ointment for topical application using a Q-tip. Caution patient to avoid getting ointment in the eye.   Other orders -     Mupirocin; Apply 1 Application topically 2 (two) times daily.  Dispense: 22 g; Refill: 0    Follow-up: No follow-ups on file.   Kara Dies, NP

## 2023-02-01 DIAGNOSIS — R238 Other skin changes: Secondary | ICD-10-CM | POA: Insufficient documentation

## 2023-02-01 NOTE — Assessment & Plan Note (Signed)
Likely due to glasses rubbing against skin. Patient has been self-medicating with unknown eye drops and cream. -Prescribe Bactroban ointment for topical application using a Q-tip. Caution patient to avoid getting ointment in the eye.

## 2023-02-25 ENCOUNTER — Ambulatory Visit: Payer: MEDICARE | Admitting: Nurse Practitioner

## 2023-03-03 ENCOUNTER — Ambulatory Visit: Payer: MEDICARE | Admitting: *Deleted

## 2023-03-03 VITALS — Ht 63.0 in | Wt 110.0 lb

## 2023-03-03 DIAGNOSIS — Z780 Asymptomatic menopausal state: Secondary | ICD-10-CM

## 2023-03-03 DIAGNOSIS — Z0000 Encounter for general adult medical examination without abnormal findings: Secondary | ICD-10-CM

## 2023-03-03 NOTE — Progress Notes (Signed)
 Subjective:   Brittany Burch is a 73 y.o. female who presents for Medicare Annual (Subsequent) preventive examination.  Visit Complete: Virtual I connected with  Wyatt Haste Santini on 03/03/23 by a audio enabled telemedicine application and verified that I am speaking with the correct person using two identifiers.This patient declined Interactive audio and Acupuncturist. Therefore the visit was completed with audio only.   Patient Location: Home  Provider Location: Home Office  I discussed the limitations of evaluation and management by telemedicine. The patient expressed understanding and agreed to proceed.  Vital Signs: Because this visit was a virtual/telehealth visit, some criteria may be missing or patient reported. Any vitals not documented were not able to be obtained and vitals that have been documented are patient reported.   Cardiac Risk Factors include: advanced age (>5men, >55 women);dyslipidemia;smoking/ tobacco exposure     Objective:    Today's Vitals   03/03/23 1459  Weight: 110 lb (49.9 kg)  Height: 5\' 3"  (1.6 m)   Body mass index is 19.49 kg/m.     03/03/2023    3:09 PM 10/20/2022    9:29 AM 03/02/2022   10:01 AM 02/26/2022    4:16 PM 11/27/2019   10:10 AM 11/24/2018    9:45 AM 12/31/2017    7:02 AM  Advanced Directives  Does Patient Have a Medical Advance Directive? No No No No Yes Yes Yes  Type of Advance Directive     Healthcare Power of Attorney Living will Living will  Does patient want to make changes to medical advance directive?     No - Patient declined    Copy of Healthcare Power of Attorney in Chart?     No - copy requested    Would patient like information on creating a medical advance directive? No - Patient declined No - Patient declined  Yes (ED - Information included in AVS)       Current Medications (verified) Outpatient Encounter Medications as of 03/03/2023  Medication Sig   albuterol (VENTOLIN HFA) 108 (90 Base)  MCG/ACT inhaler Inhale 1-2 puffs into the lungs every 4 (four) hours as needed for wheezing or shortness of breath.   aspirin EC 81 MG tablet Take 81 mg by mouth daily.   atorvastatin (LIPITOR) 80 MG tablet Take 1 tablet (80 mg total) by mouth daily at 6 PM.   donepezil (ARICEPT) 5 MG tablet Take 1 tablet (5 mg total) by mouth daily.   ezetimibe (ZETIA) 10 MG tablet Take 1 tablet (10 mg total) by mouth daily.   Fluticasone-Umeclidin-Vilant (TRELEGY ELLIPTA) 100-62.5-25 MCG/ACT AEPB Inhale 1 puff into the lungs daily.   [DISCONTINUED] mupirocin ointment (BACTROBAN) 2 % Apply 1 Application topically 2 (two) times daily. (Patient not taking: Reported on 03/03/2023)   No facility-administered encounter medications on file as of 03/03/2023.    Allergies (verified) Patient has no known allergies.   History: Past Medical History:  Diagnosis Date   Actinic keratoses 11/17/2017   Allergic rhinitis 02/16/2017   Aortic atherosclerosis 06/02/2019   CAD (coronary artery disease) 11/17/2017   COPD (chronic obstructive pulmonary disease) 11/17/2017   Moderate ct chest 05/31/20   Hip pain 02/16/2017   History of cervical cancer    HLD (hyperlipidemia) 02/16/2017   Mild Alzheimer's dementia 07/09/2022   Tobacco use 11/17/2017   Urinary incontinence 02/16/2017   Vitamin D deficiency 11/17/2017   Past Surgical History:  Procedure Laterality Date   ABDOMINAL HYSTERECTOMY     1983   COLONOSCOPY  WITH PROPOFOL N/A 12/31/2017   Procedure: COLONOSCOPY WITH PROPOFOL;  Surgeon: Pasty Spillers, MD;  Location: [facility] ENDOSCOPY;  Service: Endoscopy;  Laterality: N/A;   Family History  Problem Relation Age of Onset   Cancer Mother        lung cancer smoker died 32   Muscular dystrophy Father    Muscular dystrophy Sister    Muscular dystrophy Sister    Breast cancer Neg Hx    Social History   Socioeconomic History   Marital status: Widowed    Spouse name: Not on file   Number of children: 2    Years of education: 12   Highest education level: High school graduate  Occupational History   Occupation: Retired  Tobacco Use   Smoking status: Every Day    Current packs/day: 0.50    Average packs/day: 0.5 packs/day for 46.0 years (23.0 ttl pk-yrs)    Types: Cigarettes   Smokeless tobacco: Never   Tobacco comments:    only smokes a few a day now  Vaping Use   Vaping status: Never Used  Substance and Sexual Activity   Alcohol use: No   Drug use: No   Sexual activity: Never  Other Topics Concern   Not on file  Social History Narrative   Widowed    12 grade education    Lives with daughter   Retired   Right handed   One floor home   Social Drivers of Health   Financial Resource Strain: Low Risk  (03/03/2023)   Overall Financial Resource Strain (CARDIA)    Difficulty of Paying Living Expenses: Not hard at all  Food Insecurity: No Food Insecurity (03/03/2023)   Hunger Vital Sign    Worried About Running Out of Food in the Last Year: Never true    Ran Out of Food in the Last Year: Never true  Transportation Needs: No Transportation Needs (03/03/2023)   PRAPARE - Administrator, Civil Service (Medical): No    Lack of Transportation (Non-Medical): No  Physical Activity: Sufficiently Active (03/03/2023)   Exercise Vital Sign    Days of Exercise per Week: 7 days    Minutes of Exercise per Session: 30 min  Stress: No Stress Concern Present (03/03/2023)   [employer] of Occupational Health - Occupational Stress Questionnaire    Feeling of Stress : Not at all  Social Connections: Moderately Isolated (03/03/2023)   Social Connection and Isolation Panel [NHANES]    Frequency of Communication with Friends and Family: More than three times a week    Frequency of Social Gatherings with Friends and Family: More than three times a week    Attends Religious Services: More than 4 times per year    Active Member of [employer] or Organizations: No    Attends Tax inspector Meetings: Never    Marital Status: Widowed    Tobacco Counseling Ready to quit: Not Answered Counseling given: Not Answered Tobacco comments: only smokes a few a day now   Clinical Intake:  Pre-visit preparation completed: Yes  Pain : No/denies pain     BMI - recorded: 19.49 Nutritional Status: BMI of 19-24  Normal Nutritional Risks: None Diabetes: No  How often do you need to have someone help you when you read instructions, pamphlets, or other written materials from your doctor or pharmacy?: 1 - Never  Interpreter Needed?: No  Information entered by :: R. Darroll Bredeson LPN   Activities of Daily Living    03/03/2023  3:01 PM  In your present state of health, do you have any difficulty performing the following activities:  Hearing? 0  Vision? 0  Comment glasses  Difficulty concentrating or making decisions? 0  Walking or climbing stairs? 0  Dressing or bathing? 0  Doing errands, shopping? 1  Preparing Food and eating ? N  Using the Toilet? N  In the past six months, have you accidently leaked urine? N  Do you have problems with loss of bowel control? N  Managing your Medications? N  Managing your Finances? N  Housekeeping or managing your Housekeeping? N    Patient Care Team: Bethanie Dicker, NP as PCP - General (Nurse Practitioner) Debbe Odea, MD as PCP - Cardiology (Cardiology)  Indicate any recent Medical Services you may have received from other than [facility] providers in the past year (date may be approximate).     Assessment:   This is a routine wellness examination for Zeinab.  Hearing/Vision screen Hearing Screening - Comments:: No issues Vision Screening - Comments:: glasses   Goals Addressed             This Visit's Progress    Patient Stated       Wants to exercise more       Depression Screen    03/03/2023    3:05 PM 01/29/2023    2:25 PM 08/25/2022    8:19 AM 02/26/2022    4:14 PM 02/20/2022    9:01 AM 01/15/2021     8:31 AM 01/01/2021   11:27 AM  PHQ 2/9 Scores  PHQ - 2 Score 0 0 0 0 0 0 3  PHQ- 9 Score 0 0 0        Fall Risk    03/03/2023    3:02 PM 01/29/2023    2:25 PM 10/20/2022    9:29 AM 08/25/2022    8:19 AM 03/02/2022   10:01 AM  Fall Risk   Falls in the past year? 0 0 0 0 0  Number falls in past yr: 0 0 0 0 0  Injury with Fall? 0 0 0 0 0  Risk for fall due to : No Fall Risks No Fall Risks  No Fall Risks   Follow up Falls prevention discussed;Falls evaluation completed Falls evaluation completed Falls evaluation completed Falls evaluation completed Falls evaluation completed    MEDICARE RISK AT HOME: Medicare Risk at Home Any stairs in or around the home?: No If so, are there any without handrails?: No Home free of loose throw rugs in walkways, pet beds, electrical cords, etc?: Yes Adequate lighting in your home to reduce risk of falls?: Yes Life alert?: No Use of a cane, walker or w/c?: No Grab bars in the bathroom?: Yes Shower chair or bench in shower?: No Elevated toilet seat or a handicapped toilet?: No     Cognitive Function:    11/17/2017    8:45 AM  MMSE - Mini Mental State Exam  Orientation to time 5  Orientation to Place 5  Registration 3  Attention/ Calculation 5  Recall 3  Language- name 2 objects 2  Language- repeat 1  Language- follow 3 step command 3  Language- read & follow direction 1  Write a sentence 1  Copy design 1  Total score 30      03/02/2022   12:00 PM  Montreal Cognitive Assessment   Visuospatial/ Executive (0/5) 3  Naming (0/3) 1  Attention: Read list of digits (0/2) 2  Attention: Read list  of letters (0/1) 1  Attention: Serial 7 subtraction starting at 100 (0/3) 2  Language: Repeat phrase (0/2) 1  Language : Fluency (0/1) 0  Abstraction (0/2) 0  Delayed Recall (0/5) 1  Orientation (0/6) 2  Total 13  Adjusted Score (based on education) 13      03/03/2023    3:09 PM 03/05/2022   11:56 AM 11/27/2019   10:12 AM 11/24/2018    9:53  AM  6CIT Screen  What Year? 0 points 0 points 0 points 0 points  What month? 0 points 0 points 0 points 0 points  What time? 0 points 0 points 0 points 0 points  Count back from 20 0 points 0 points 0 points 0 points  Months in reverse 0 points 0 points 0 points 0 points  Repeat phrase 0 points 0 points 0 points 0 points  Total Score 0 points 0 points 0 points 0 points    Immunizations Immunization History  Administered Date(s) Administered   Fluad Quad(high Dose 65+) 12/15/2019, 10/29/2020   Influenza, High Dose Seasonal PF 09/24/2017   Influenza-Unspecified 10/17/2018   Moderna Covid-19 Vaccine Bivalent Booster 75yrs & up 11/14/2020   Moderna SARS-COV2 Booster Vaccination 11/03/2019   Moderna Sars-Covid-2 Vaccination 03/11/2019, 04/08/2019   Pneumococcal Conjugate-13 06/22/2016   Pneumococcal Polysaccharide-23 09/04/2017   Tdap 05/17/2018   Zoster Recombinant(Shingrix) 11/23/2017, 01/31/2018    TDAP status: Up to date  Flu Vaccine status: Up to date  Pneumococcal vaccine status: Up to date  Covid-19 vaccine status: Completed vaccines  Qualifies for Shingles Vaccine? Yes   Zostavax completed No   Shingrix Completed?: Yes  Screening Tests Health Maintenance  Topic Date Due   COVID-19 Vaccine (4 - 2024-25 season) 09/13/2022   Medicare Annual Wellness (AWV)  02/27/2023   INFLUENZA VACCINE  04/12/2023 (Originally 08/13/2022)   Lung Cancer Screening  01/08/2024   MAMMOGRAM  08/16/2024   Colonoscopy  01/01/2028   DTaP/Tdap/Td (2 - Td or Tdap) 05/16/2028   Pneumonia Vaccine 41+ Years old  Completed   DEXA SCAN  Completed   Hepatitis C Screening  Completed   Zoster Vaccines- Shingrix  Completed   HPV VACCINES  Aged Out    Health Maintenance  Health Maintenance Due  Topic Date Due   COVID-19 Vaccine (4 - 2024-25 season) 09/13/2022   Medicare Annual Wellness (AWV)  02/27/2023    Colorectal cancer screening: Type of screening: Colonoscopy. Completed 12/2017. Repeat  every 10 years  Mammogram status: Completed 08/2022. Repeat every year  Bone Density status: Ordered 03/03/23. Pt provided with contact info and advised to call to schedule appt.  Lung Cancer Screening: (Low Dose CT Chest recommended if Age 41-80 years, 20 pack-year currently smoking OR have quit w/in 15years.) does qualify.     Additional Screening:  Hepatitis C Screening: does qualify; Completed 04/2017  Vision Screening: Recommended annual ophthalmology exams for early detection of glaucoma and other disorders of the eye. Is the patient up to date with their annual eye exam?  Yes  Who is the provider or what is the name of the office in which the patient attends annual eye exams? [facility] If pt is not established with a provider, would they like to be referred to a provider to establish care? No .   Dental Screening: Recommended annual dental exams for proper oral hygiene    Community Resource Referral / Chronic Care Management: CRR required this visit?  No   CCM required this visit?  No  Plan:     I have personally reviewed and noted the following in the patient's chart:   Medical and social history Use of alcohol, tobacco or illicit drugs  Current medications and supplements including opioid prescriptions. Patient is not currently taking opioid prescriptions. Functional ability and status Nutritional status Physical activity Advanced directives List of other physicians Hospitalizations, surgeries, and ER visits in previous 12 months Vitals Screenings to include cognitive, depression, and falls Referrals and appointments  In addition, I have reviewed and discussed with patient certain preventive protocols, quality metrics, and best practice recommendations. A written personalized care plan for preventive services as well as general preventive health recommendations were provided to patient.     Sydell Axon, LPN   [id number]   After Visit Summary: (MyChart)  Due to this being a telephonic visit, the after visit summary with patients personalized plan was offered to patient via MyChart   Nurse Notes: None

## 2023-03-03 NOTE — Patient Instructions (Signed)
 [name] , Thank you for taking time to come for your Medicare Wellness Visit. I appreciate your ongoing commitment to your health goals. Please review the following plan we discussed and let me know if I can assist you in the future.   Referrals/Orders/Follow-Ups/Clinician Recommendations: Bone Density order has been placed. You have an order for:  []   2D Mammogram  []   3D Mammogram  [x]   Bone Density     Please call for appointment:  [facility]  [address] [place] [address] [phone]    Make sure to wear two-piece clothing.  No lotions, powders, or deodorants the day of the appointment. Make sure to bring picture ID and insurance card.  Bring list of medications you are currently taking including any supplements.   Schedule your [place] screening mammogram through MyChart!   Log into your MyChart account.  Go to 'Visit' (or 'Appointments' if on mobile App) --> Schedule an Appointment  Under 'Select a Reason for Visit' choose the Mammogram Screening option.  Complete the pre-visit questions and select the time and place that best fits your schedule.    This is a list of the screening recommended for you and due dates:  Health Maintenance  Topic Date Due   COVID-19 Vaccine (4 - 2024-25 season) 09/13/2022   Screening for Lung Cancer  01/08/2024   Medicare Annual Wellness Visit  03/02/2024   Mammogram  08/16/2024   Colon Cancer Screening  01/01/2028   DTaP/Tdap/Td vaccine (2 - Td or Tdap) 05/16/2028   Pneumonia Vaccine  Completed   Flu Shot  Completed   DEXA scan (bone density measurement)  Completed   Hepatitis C Screening  Completed   Zoster (Shingles) Vaccine  Completed   HPV Vaccine  Aged Out    Advanced directives: (Declined) Advance directive discussed with you today. Even though you declined this today, please call our office should you change your mind, and we can give you the proper  paperwork for you to fill out.  Next Medicare Annual Wellness Visit scheduled for next year: Yes 03/07/24 @ 10:10

## 2023-04-09 ENCOUNTER — Other Ambulatory Visit: Payer: Self-pay | Admitting: Nurse Practitioner

## 2023-04-09 DIAGNOSIS — E7800 Pure hypercholesterolemia, unspecified: Secondary | ICD-10-CM

## 2023-04-09 DIAGNOSIS — I2510 Atherosclerotic heart disease of native coronary artery without angina pectoris: Secondary | ICD-10-CM

## 2023-04-20 ENCOUNTER — Encounter: Payer: Self-pay | Admitting: Physician Assistant

## 2023-04-20 ENCOUNTER — Ambulatory Visit (INDEPENDENT_AMBULATORY_CARE_PROVIDER_SITE_OTHER): Payer: MEDICARE | Admitting: Physician Assistant

## 2023-04-20 VITALS — BP 94/63 | HR 91 | Resp 18 | Ht 63.0 in | Wt 110.0 lb

## 2023-04-20 DIAGNOSIS — G309 Alzheimer's disease, unspecified: Secondary | ICD-10-CM

## 2023-04-20 DIAGNOSIS — F02A0 Dementia in other diseases classified elsewhere, mild, without behavioral disturbance, psychotic disturbance, mood disturbance, and anxiety: Secondary | ICD-10-CM | POA: Diagnosis not present

## 2023-04-20 MED ORDER — DONEPEZIL HCL 10 MG PO TABS: 10.0000 mg | ORAL_TABLET | Freq: Every day | ORAL | 3 refills | Status: DC

## 2023-04-20 NOTE — Patient Instructions (Signed)
 It was a pleasure to see you today at our office.   Recommendations:  Increase Donepezil to 10 mg daily. Side effects discussed   Follow up in 6 months  Whom to call:  Memory  decline, memory medications: Call our office [phone]   For psychiatric meds, mood meds: Please have your primary care physician manage these medications.   Counseling regarding caregiver distress, including caregiver depression, anxiety and issues regarding community resources, adult day care programs, adult living facilities, or memory care questions:   Feel free to contact Misty Lisabeth Register, Social Worker at [phone]   For assessment of decision of mental capacity and competency:  Call Dr. Erick Blinks, geriatric psychiatrist at [phone]  For guidance in geriatric dementia issues please call [facility] [phone]  For guidance regarding [facility] and if placement were needed at the facility, contact Sidney Ace, Social Worker tel: [phone]  If you have any severe symptoms of a stroke, or other severe issues such as confusion,severe chills or fever, etc call 911 or go to the ER as you may need to be evaluated further   Feel free to visit Facebook page " [email or web address]" for tips of how to care for people with memory problems.      RECOMMENDATIONS FOR ALL PATIENTS WITH MEMORY PROBLEMS: 1. Continue to exercise (Recommend 30 minutes of walking everyday, or 3 hours every week) 2. Increase social interactions - continue going to [place] and enjoy social gatherings with friends and family 3. Eat healthy, avoid fried foods and eat more fruits and vegetables 4. Maintain adequate blood pressure, blood sugar, and blood cholesterol level. Reducing the risk of stroke and cardiovascular disease also helps promoting better memory. 5. Avoid stressful situations. Live a simple life and avoid aggravations. Organize your time and prepare for the next day in anticipation. 6.  Sleep well, avoid any interruptions of sleep and avoid any distractions in the bedroom that may interfere with adequate sleep quality 7. Avoid sugar, avoid sweets as there is a strong link between excessive sugar intake, diabetes, and cognitive impairment We discussed the Mediterranean diet, which has been shown to help patients reduce the risk of progressive memory disorders and reduces cardiovascular risk. This includes eating fish, eat fruits and green leafy vegetables, nuts like almonds and hazelnuts, walnuts, and also use olive oil. Avoid fast foods and fried foods as much as possible. Avoid sweets and sugar as sugar use has been linked to worsening of memory function.  There is always a concern of gradual progression of memory problems. If this is the case, then we may need to adjust level of care according to patient needs. Support, both to the patient and caregiver, should then be put into place.      You have been referred for a neuropsychological evaluation (i.e., evaluation of memory and thinking abilities). Please bring someone with you to this appointment if possible, as it is helpful for the doctor to hear from both you and another adult who knows you well. Please bring eyeglasses and hearing aids if you wear them.    The evaluation will take approximately 3 hours and has two parts:   The first part is a clinical interview with the neuropsychologist (Dr. Milbert Coulter or Dr. Roseanne Reno). During the interview, the neuropsychologist will speak with you and the individual you brought to the appointment.    The second part of the evaluation is testing with the doctor's technician Annabelle Harman or Selena Batten). During the testing, the technician  will ask you to remember different types of material, solve problems, and answer some questionnaires. Your family member will not be present for this portion of the evaluation.   Please note: We must reserve several hours of the neuropsychologist's time and the  psychometrician's time for your evaluation appointment. As such, there is a No-Show fee of $100. If you are unable to attend any of your appointments, please contact our office as soon as possible to reschedule.    FALL PRECAUTIONS: Be cautious when walking. Scan the area for obstacles that may increase the risk of trips and falls. When getting up in the mornings, sit up at the edge of the bed for a few minutes before getting out of bed. Consider elevating the bed at the head end to avoid drop of blood pressure when getting up. Walk always in a well-lit room (use night lights in the walls). Avoid area rugs or power cords from appliances in the middle of the walkways. Use a walker or a cane if necessary and consider physical therapy for balance exercise. Get your eyesight checked regularly.  FINANCIAL OVERSIGHT: Supervision, especially oversight when making financial decisions or transactions is also recommended.  HOME SAFETY: Consider the safety of the kitchen when operating appliances like stoves, microwave oven, and blender. Consider having supervision and share cooking responsibilities until no longer able to participate in those. Accidents with firearms and other hazards in the house should be identified and addressed as well.   ABILITY TO BE LEFT ALONE: If patient is unable to contact 911 operator, consider using LifeLine, or when the need is there, arrange for someone to stay with patients. Smoking is a fire hazard, consider supervision or cessation. Risk of wandering should be assessed by caregiver and if detected at any point, supervision and safe proof recommendations should be instituted.  MEDICATION SUPERVISION: Inability to self-administer medication needs to be constantly addressed. Implement a mechanism to ensure safe administration of the medications.   DRIVING: Regarding driving, in patients with progressive memory problems, driving will be impaired. We advise to have someone else do  the driving if trouble finding directions or if minor accidents are reported. Independent driving assessment is available to determine safety of driving.   If you are interested in the driving assessment, you can contact the following:  [facility] in [place] [phone]  [facility] [phone]  [facility] [phone] or [phone]    Mediterranean Diet A Mediterranean diet refers to food and lifestyle choices that are based on the traditions of countries located on the [employer]. This way of eating has been shown to help prevent certain conditions and improve outcomes for people who have chronic diseases, like kidney disease and heart disease. What are tips for following this plan? Lifestyle  Cook and eat meals together with your family, when possible. Drink enough fluid to keep your urine clear or pale yellow. Be physically active every day. This includes: Aerobic exercise like running or swimming. Leisure activities like gardening, walking, or housework. Get 7-8 hours of sleep each night. If recommended by your health care provider, drink red wine in moderation. This means 1 glass a day for nonpregnant women and 2 glasses a day for men. A glass of wine equals 5 oz (150 mL). Reading food labels  Check the serving size of packaged foods. For foods such as rice and pasta, the serving size refers to the amount of cooked product, not dry. Check the  total fat in packaged foods. Avoid foods that have saturated fat or trans fats. Check the ingredients list for added sugars, such as corn syrup. Shopping  At the grocery store, buy most of your food from the areas near the walls of the store. This includes: Fresh fruits and vegetables (produce). Grains, beans, nuts, and seeds. Some of these may be available in unpackaged forms or large amounts (in bulk). Fresh seafood. Poultry and eggs. Low-fat  dairy products. Buy whole ingredients instead of prepackaged foods. Buy fresh fruits and vegetables in-season from local farmers markets. Buy frozen fruits and vegetables in resealable bags. If you do not have access to quality fresh seafood, buy precooked frozen shrimp or canned fish, such as tuna, salmon, or sardines. Buy small amounts of raw or cooked vegetables, salads, or olives from the deli or salad bar at your store. Stock your pantry so you always have certain foods on hand, such as olive oil, canned tuna, canned tomatoes, rice, pasta, and beans. Cooking  Cook foods with extra-virgin olive oil instead of using butter or other vegetable oils. Have meat as a side dish, and have vegetables or grains as your main dish. This means having meat in small portions or adding small amounts of meat to foods like pasta or stew. Use beans or vegetables instead of meat in common dishes like chili or lasagna. Experiment with different cooking methods. Try roasting or broiling vegetables instead of steaming or sauteing them. Add frozen vegetables to soups, stews, pasta, or rice. Add nuts or seeds for added healthy fat at each meal. You can add these to yogurt, salads, or vegetable dishes. Marinate fish or vegetables using olive oil, lemon juice, garlic, and fresh herbs. Meal planning  Plan to eat 1 vegetarian meal one day each week. Try to work up to 2 vegetarian meals, if possible. Eat seafood 2 or more times a week. Have healthy snacks readily available, such as: Vegetable sticks with hummus. Greek yogurt. Fruit and nut trail mix. Eat balanced meals throughout the week. This includes: Fruit: 2-3 servings a day Vegetables: 4-5 servings a day Low-fat dairy: 2 servings a day Fish, poultry, or lean meat: 1 serving a day Beans and legumes: 2 or more servings a week Nuts and seeds: 1-2 servings a day Whole grains: 6-8 servings a day Extra-virgin olive oil: 3-4 servings a day Limit red meat and  sweets to only a few servings a month What are my food choices? Mediterranean diet Recommended Grains: Whole-grain pasta. Brown rice. Bulgar wheat. Polenta. Couscous. Whole-wheat bread. Orpah Cobb. Vegetables: Artichokes. Beets. Broccoli. Cabbage. Carrots. Eggplant. Green beans. Chard. Kale. Spinach. Onions. Leeks. Peas. Squash. Tomatoes. Peppers. Radishes. Fruits: Apples. Apricots. Avocado. Berries. Bananas. Cherries. Dates. Figs. Grapes. Lemons. Melon. Oranges. Peaches. Plums. Pomegranate. Meats and other protein foods: Beans. Almonds. Sunflower seeds. Pine nuts. Peanuts. Cod. Salmon. Scallops. Shrimp. Tuna. Tilapia. Clams. Oysters. Eggs. Dairy: Low-fat milk. Cheese. Greek yogurt. Beverages: Water. Red wine. Herbal tea. Fats and oils: Extra virgin olive oil. Avocado oil. Grape seed oil. Sweets and desserts: [place] yogurt with honey. Baked apples. Poached pears. Trail mix. Seasoning and other foods: Basil. Cilantro. Coriander. Cumin. Mint. Parsley. Sage. Rosemary. Tarragon. Garlic. Oregano. Thyme. Pepper. Balsalmic vinegar. Tahini. Hummus. Tomato sauce. Olives. Mushrooms. Limit these Grains: Prepackaged pasta or rice dishes. Prepackaged cereal with added sugar. Vegetables: Deep fried potatoes (french fries). Fruits: Fruit canned in syrup. Meats and other protein foods: Beef. Pork. Lamb. Poultry with skin. Hot dogs. Tomasa Blase. Dairy: Ice cream. Sour cream.  Whole milk. Beverages: Juice. Sugar-sweetened soft drinks. Beer. Liquor and spirits. Fats and oils: Butter. Canola oil. Vegetable oil. Beef fat (tallow). Lard. Sweets and desserts: Cookies. Cakes. Pies. Candy. Seasoning and other foods: Mayonnaise. Premade sauces and marinades. The items listed may not be a complete list. Talk with your dietitian about what dietary choices are right for you. Summary The Mediterranean diet includes both food and lifestyle choices. Eat a variety of fresh fruits and vegetables, beans, nuts, seeds, and whole  grains. Limit the amount of red meat and sweets that you eat. Talk with your health care provider about whether it is safe for you to drink red wine in moderation. This means 1 glass a day for nonpregnant women and 2 glasses a day for men. A glass of wine equals 5 oz (150 mL). This information is not intended to replace advice given to you by your health care provider. Make sure you discuss any questions you have with your health care provider. Document Released: 08/22/2015 Document Revised: 09/24/2015 Document Reviewed: 08/22/2015 Elsevier Interactive Patient Education  2017 [employer].
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
## 2023-06-09 ENCOUNTER — Other Ambulatory Visit: Payer: Self-pay | Admitting: Nurse Practitioner

## 2023-06-09 DIAGNOSIS — E7800 Pure hypercholesterolemia, unspecified: Secondary | ICD-10-CM

## 2023-06-09 DIAGNOSIS — I2510 Atherosclerotic heart disease of native coronary artery without angina pectoris: Secondary | ICD-10-CM

## 2023-06-10 NOTE — Telephone Encounter (Signed)
 Called and spoke to [place] on pts DPR to get her scheduled for a follow up, pt has been scheduled

## 2023-06-18 ENCOUNTER — Encounter: Payer: Self-pay | Admitting: Cardiology

## 2023-06-23 ENCOUNTER — Ambulatory Visit (INDEPENDENT_AMBULATORY_CARE_PROVIDER_SITE_OTHER): Payer: MEDICARE | Admitting: Nurse Practitioner

## 2023-06-23 ENCOUNTER — Encounter: Payer: Self-pay | Admitting: Nurse Practitioner

## 2023-06-23 VITALS — BP 118/60 | HR 61 | Temp 98.0°F | Ht 63.0 in | Wt 107.0 lb

## 2023-06-23 DIAGNOSIS — G309 Alzheimer's disease, unspecified: Secondary | ICD-10-CM | POA: Diagnosis not present

## 2023-06-23 DIAGNOSIS — F02A0 Dementia in other diseases classified elsewhere, mild, without behavioral disturbance, psychotic disturbance, mood disturbance, and anxiety: Secondary | ICD-10-CM

## 2023-06-23 DIAGNOSIS — I2510 Atherosclerotic heart disease of native coronary artery without angina pectoris: Secondary | ICD-10-CM | POA: Diagnosis not present

## 2023-06-23 DIAGNOSIS — E559 Vitamin D deficiency, unspecified: Secondary | ICD-10-CM

## 2023-06-23 DIAGNOSIS — E7800 Pure hypercholesterolemia, unspecified: Secondary | ICD-10-CM | POA: Diagnosis not present

## 2023-06-23 DIAGNOSIS — Z1329 Encounter for screening for other suspected endocrine disorder: Secondary | ICD-10-CM

## 2023-06-23 DIAGNOSIS — J439 Emphysema, unspecified: Secondary | ICD-10-CM | POA: Diagnosis not present

## 2023-06-23 MED ORDER — TRELEGY ELLIPTA 100-62.5-25 MCG/ACT IN AEPB: 1.0000 | INHALATION_SPRAY | Freq: Every day | RESPIRATORY_TRACT | 5 refills | Status: AC

## 2023-06-23 MED ORDER — EZETIMIBE 10 MG PO TABS
10.0000 mg | ORAL_TABLET | Freq: Every day | ORAL | 3 refills | Status: AC
Start: 2023-06-23 — End: ?

## 2023-06-23 NOTE — Progress Notes (Signed)
 Brittany Burkitt, NP-C Phone: [phone]  Brittany Burch is a 73 y.o. female who presents today for follow up.   Discussed the use of AI scribe software for clinical note transcription with the patient, who gave verbal consent to proceed.  History of Present Illness   Brittany Burch is a 73 year old female with COPD and hyperlipidemia who presents for a routine follow-up visit.  She has no new problems or concerns since her last visit in August of the previous year. She continues to take her cholesterol medications, Lipitor and Zetia , and her cholesterol levels were last checked in August.  She has a history of COPD and uses Trelegy as a maintenance inhaler and albuterol  as a rescue inhaler. She uses the Trelegy inhaler intermittently rather than daily, and the albuterol  is used as needed, but not frequently. No shortness of breath or wheezing, but her family member notes frequent coughing, especially in the mornings and when laughing, which sometimes sounds productive. She does not consistently cough up phlegm.  Her medication regimen also includes Aricept , which was increased to 10 mg by neurology in April. She is unsure of her next follow-up with neurology. Additionally, she takes Benadryl for allergies, which she uses as needed and reports it does not make her sleepy.      Social History   Tobacco Use  Smoking Status Every Day   Current packs/day: 0.50   Average packs/day: 0.5 packs/day for 46.0 years (23.0 ttl pk-yrs)   Types: Cigarettes  Smokeless Tobacco Never  Tobacco Comments   only smokes a few a day now    Current Outpatient Medications on File Prior to Visit  Medication Sig Dispense Refill   albuterol  (VENTOLIN  HFA) 108 (90 Base) MCG/ACT inhaler Inhale 1-2 puffs into the lungs every 4 (four) hours as needed for wheezing or shortness of breath. 54 g 3   aspirin EC 81 MG tablet Take 81 mg by mouth daily.     atorvastatin  (LIPITOR) 80 MG tablet TAKE 1 TABLET DAILY AT  6 P.M. 90 tablet 3   donepezil  (ARICEPT ) 10 MG tablet Take 1 tablet (10 mg total) by mouth daily. 90 tablet 3   No current facility-administered medications on file prior to visit.     ROS see history of present illness  Objective  Physical Exam Vitals:   06/23/23 1121  BP: 118/60  Pulse: 61  Temp: 98 F (36.7 C)  SpO2: 98%    BP Readings from Last 3 Encounters:  06/23/23 118/60  04/20/23 94/63  01/29/23 116/74   Wt Readings from Last 3 Encounters:  06/23/23 107 lb (48.5 kg)  04/20/23 110 lb (49.9 kg)  03/03/23 110 lb (49.9 kg)    Physical Exam Constitutional:      General: She is not in acute distress.    Appearance: Normal appearance.  HENT:     Head: Normocephalic.   Cardiovascular:     Rate and Rhythm: Normal rate and regular rhythm.     Heart sounds: Normal heart sounds.  Pulmonary:     Effort: Pulmonary effort is normal.     Breath sounds: Normal breath sounds.   Skin:    General: Skin is warm and dry.   Neurological:     General: No focal deficit present.     Mental Status: She is alert. Mental status is at baseline.   Psychiatric:        Mood and Affect: Mood normal.        Behavior:  Behavior normal.      Assessment/Plan: Please see individual problem list.  Pulmonary emphysema, unspecified emphysema type (HCC) Assessment & Plan: She experiences COPD with morning and occasional productive cough, possibly due to inconsistent Trelegy use. Emphasized the importance of daily Trelegy to reduce symptoms. Suggested antihistamines and/or Mucinex for mucus management. Use Trelegy inhaler daily and albuterol  inhaler as needed.   Orders: -     Trelegy Ellipta ; Inhale 1 puff into the lungs daily.  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 Continue Aricept  and follow up with neurology.   Orders: -     CBC with Differential/Platelet -     Comprehensive metabolic panel with GFR  Pure hypercholesterolemia Assessment & Plan: Her hyperlipidemia is managed with Lipitor and Zetia . Continued use is necessary to maintain control. Continue Lipitor 80 mg daily and Zetia  10 mg daily. Check lipid panel.   Orders: -     Lipid panel -     Ezetimibe ; Take 1 tablet (10 mg total) by mouth daily.  Dispense: 90 tablet; Refill: 3  Coronary artery disease involving native coronary artery of native heart without angina pectoris Assessment & Plan: Continue ASA 81 mg daily, Lipitor 80 mg daily and Zetia  10 mg daily. Follow up with Cardiology as scheduled.   Orders: -     Ezetimibe ; Take 1 tablet (10 mg total) by mouth daily.  Dispense: 90 tablet; Refill: 3  Vitamin D  deficiency -     VITAMIN D  25 Hydroxy (Vit-D Deficiency, Fractures)  Thyroid  disorder screen -     TSH      Return in about 6 months (around 12/23/2023) for Follow up.   Brittany Burkitt, NP-C [facility] [place]

## 2023-06-24 ENCOUNTER — Ambulatory Visit: Payer: Self-pay | Admitting: Nurse Practitioner

## 2023-06-24 LAB — COMPREHENSIVE METABOLIC PANEL WITH GFR
ALT: 12 IU/L (ref 0–32)
AST: 21 IU/L (ref 0–40)
Albumin: 4.3 g/dL (ref 3.8–4.8)
Alkaline Phosphatase: 98 IU/L (ref 44–121)
BUN/Creatinine Ratio: 20 (ref 12–28)
BUN: 16 mg/dL (ref 8–27)
Bilirubin Total: 0.3 mg/dL (ref 0.0–1.2)
CO2: 24 mmol/L (ref 20–29)
Calcium: 9.9 mg/dL (ref 8.7–10.3)
Chloride: 102 mmol/L (ref 96–106)
Creatinine, Ser: 0.79 mg/dL (ref 0.57–1.00)
Globulin, Total: 2.2 g/dL (ref 1.5–4.5)
Glucose: 73 mg/dL (ref 70–99)
Potassium: 4.5 mmol/L (ref 3.5–5.2)
Sodium: 141 mmol/L (ref 134–144)
Total Protein: 6.5 g/dL (ref 6.0–8.5)
eGFR: 78 mL/min/{1.73_m2} (ref >59)

## 2023-06-24 LAB — CBC WITH DIFFERENTIAL/PLATELET
Basophils Absolute: 0 10*3/uL (ref 0.0–0.2)
Basos: 0 %
EOS (ABSOLUTE): 0.1 10*3/uL (ref 0.0–0.4)
Eos: 1 %
Hematocrit: 42.8 % (ref 34.0–46.6)
Hemoglobin: 13.9 g/dL (ref 11.1–15.9)
Immature Grans (Abs): 0 10*3/uL (ref 0.0–0.1)
Immature Granulocytes: 0 %
Lymphocytes Absolute: 2.8 10*3/uL (ref 0.7–3.1)
Lymphs: 35 %
MCH: 31.4 pg (ref 26.6–33.0)
MCHC: 32.5 g/dL (ref 31.5–35.7)
MCV: 97 fL (ref 79–97)
Monocytes Absolute: 0.7 10*3/uL (ref 0.1–0.9)
Monocytes: 9 %
Neutrophils Absolute: 4.4 10*3/uL (ref 1.4–7.0)
Neutrophils: 55 %
Platelets: 312 10*3/uL (ref 150–450)
RBC: 4.42 x10E6/uL (ref 3.77–5.28)
RDW: 12.4 % (ref 11.7–15.4)
WBC: 8.1 10*3/uL (ref 3.4–10.8)

## 2023-06-24 LAB — LIPID PANEL
Chol/HDL Ratio: 2.8 ratio (ref 0.0–4.4)
Cholesterol, Total: 146 mg/dL (ref 100–199)
HDL: 52 mg/dL (ref >39)
LDL Chol Calc (NIH): 75 mg/dL (ref 0–99)
Triglycerides: 103 mg/dL (ref 0–149)
VLDL Cholesterol Cal: 19 mg/dL (ref 5–40)

## 2023-06-24 LAB — TSH: TSH: 0.895 u[IU]/mL (ref 0.450–4.500)

## 2023-06-24 LAB — VITAMIN D 25 HYDROXY (VIT D DEFICIENCY, FRACTURES): Vit D, 25-Hydroxy: 74.8 ng/mL (ref 30.0–100.0)

## 2023-07-01 ENCOUNTER — Encounter: Payer: Self-pay | Admitting: Nurse Practitioner

## 2023-07-01 NOTE — Assessment & Plan Note (Signed)
 Continue ASA 81 mg daily, Lipitor 80 mg daily and Zetia  10 mg daily. Follow up with Cardiology as scheduled.

## 2023-07-01 NOTE — Assessment & Plan Note (Signed)
 She is under neurology care with a recent increase in Aricept  to 10 mg. Continue Aricept  and follow up with neurology.

## 2023-07-01 NOTE — Assessment & Plan Note (Addendum)
 Her hyperlipidemia is managed with Lipitor and Zetia . Continued use is necessary to maintain control. Continue Lipitor 80 mg daily and Zetia  10 mg daily. Check lipid panel.

## 2023-07-01 NOTE — Assessment & Plan Note (Signed)
 She experiences COPD with morning and occasional productive cough, possibly due to inconsistent Trelegy use. Emphasized the importance of daily Trelegy to reduce symptoms. Suggested antihistamines and/or Mucinex for mucus management. Use Trelegy inhaler daily and albuterol  inhaler as needed.

## 2023-07-20 ENCOUNTER — Other Ambulatory Visit: Payer: Self-pay | Admitting: Nurse Practitioner

## 2023-07-20 DIAGNOSIS — Z1231 Encounter for screening mammogram for malignant neoplasm of breast: Secondary | ICD-10-CM

## 2023-08-12 ENCOUNTER — Other Ambulatory Visit: Payer: Self-pay

## 2023-08-12 MED ORDER — DONEPEZIL HCL 10 MG PO TABS
10.0000 mg | ORAL_TABLET | Freq: Every day | ORAL | 3 refills | Status: AC
Start: 2023-08-12 — End: ?

## 2023-08-18 ENCOUNTER — Ambulatory Visit
Admission: RE | Admit: 2023-08-18 | Discharge: 2023-08-18 | Disposition: A | Discharge status: Home / Self Care | Source: Ambulatory Visit | Attending: Nurse Practitioner | Admitting: Nurse Practitioner

## 2023-08-18 DIAGNOSIS — Z1231 Encounter for screening mammogram for malignant neoplasm of breast: Secondary | ICD-10-CM | POA: Diagnosis present

## 2023-08-23 ENCOUNTER — Ambulatory Visit: Payer: Self-pay | Admitting: Nurse Practitioner

## 2023-09-30 ENCOUNTER — Other Ambulatory Visit

## 2023-10-20 ENCOUNTER — Ambulatory Visit: Payer: MEDICARE | Admitting: Physician Assistant
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
## 2023-11-08 ENCOUNTER — Telehealth: Payer: Self-pay

## 2023-11-08 NOTE — Telephone Encounter (Signed)
 Copied from CRM [id number]. Topic: General - Other >> Nov 08, 2023  9:42 AM Nessti S wrote: Reason for CRM: jennifer needed FMLA filled out again. She can email form or she can pick it up. Asking if FMLA was received via fax from employer.

## 2023-11-09 NOTE — Telephone Encounter (Signed)
 Called Delon and informed her that we have not received FMLA forms she stated she would be dropping it off soon.

## 2023-11-10 ENCOUNTER — Telehealth: Payer: Self-pay | Admitting: Nurse Practitioner

## 2023-11-10 NOTE — Telephone Encounter (Signed)
 Brittany Burch   11/10/23  8:46 AM Note FYI.SABRASABRASABRAPt daughter dropped off forms for pt placed in back mail box. Thanks

## 2023-11-10 NOTE — Telephone Encounter (Signed)
 FYI.SABRASABRASABRAPt daughter dropped off forms for pt placed in back mail box. Thanks.

## 2023-11-10 NOTE — Telephone Encounter (Signed)
 Called and informed pts daughter of forms being completed. Forms have been laced up front in designated pick up area.

## 2023-12-24 ENCOUNTER — Ambulatory Visit: Payer: MEDICARE | Admitting: Nurse Practitioner

## 2023-12-24 ENCOUNTER — Telehealth: Payer: Self-pay

## 2023-12-24 ENCOUNTER — Telehealth: Payer: Self-pay | Admitting: Physician Assistant

## 2023-12-24 ENCOUNTER — Encounter: Payer: Self-pay | Admitting: Nurse Practitioner

## 2023-12-24 VITALS — BP 120/72 | HR 67 | Temp 97.8°F | Ht 63.0 in | Wt 114.4 lb

## 2023-12-24 DIAGNOSIS — G309 Alzheimer's disease, unspecified: Secondary | ICD-10-CM | POA: Diagnosis not present

## 2023-12-24 DIAGNOSIS — Z720 Tobacco use: Secondary | ICD-10-CM

## 2023-12-24 DIAGNOSIS — I2510 Atherosclerotic heart disease of native coronary artery without angina pectoris: Secondary | ICD-10-CM | POA: Diagnosis not present

## 2023-12-24 DIAGNOSIS — J439 Emphysema, unspecified: Secondary | ICD-10-CM | POA: Diagnosis not present

## 2023-12-24 DIAGNOSIS — E7800 Pure hypercholesterolemia, unspecified: Secondary | ICD-10-CM

## 2023-12-24 DIAGNOSIS — F02A0 Dementia in other diseases classified elsewhere, mild, without behavioral disturbance, psychotic disturbance, mood disturbance, and anxiety: Secondary | ICD-10-CM

## 2023-12-24 NOTE — Assessment & Plan Note (Addendum)
 Lung cancer screening due at end of month. Referral placed. She continues to smoke daily. Encourage cessation.

## 2023-12-24 NOTE — Telephone Encounter (Signed)
 Jennifer King(daughter) called in wanting to speak with Camie or her nurse regarding paper work ( [employer]: Examination of Household, Pensions Consultant) that is needed for assistance(additional assistance) from the [place]. It will help with additional funding. She is requesting a call back.

## 2023-12-24 NOTE — Assessment & Plan Note (Signed)
 Her hyperlipidemia is managed with Lipitor and Zetia . Continued use is necessary to maintain control. Continue Lipitor 80 mg daily and Zetia  10 mg daily.

## 2023-12-24 NOTE — Telephone Encounter (Signed)
 VA benefits form placed in provider to be signed folder

## 2023-12-24 NOTE — Assessment & Plan Note (Signed)
 She is under neurology care, currently on Aricept  to 10 mg. Continue Aricept  and follow up with neurology as scheduled.

## 2023-12-24 NOTE — Assessment & Plan Note (Signed)
 Occasional productive cough. Emphasized the importance of daily Trelegy to reduce symptoms. Denies shortness of breath. Suggested antihistamines and/or Mucinex for mucus management. Use Trelegy inhaler daily and albuterol  inhaler as needed. Encourage smoking cessation.

## 2023-12-24 NOTE — Progress Notes (Signed)
 [name], NP-C Phone: [phone]  Brittany Burch is a 73 y.o. female who presents today for follow up.   Discussed the use of AI scribe software for clinical note transcription with the patient, who gave verbal consent to proceed.  History of Present Illness   Brittany Burch is a 73 year old female who presents for a routine follow-up and assistance with VA documentation. She is accompanied by her daughter.  She is currently taking Aricept  daily for dementia and continues to follow up with neurology. Her daughter is seeking additional assistance from the [place] and is in the process of completing necessary documentation to prove her need for in-home assistance.  She is on two cholesterol medications, Lipitor and Zetia , which she takes regularly.  She uses two inhalers; one is used daily, and the other is used as needed for shortness of breath. She does not need the as-needed inhaler frequently and has no shortness of breath. However, her daughter notes frequent coughing, although she does not perceive it as significant.  She continues to smoke, though less frequently in colder weather, and undergoes annual lung cancer screenings due to her smoking history.  She takes aspirin regularly and does not use any supplements or vitamins, although vitamin D  was discussed for bone health.      Tobacco Use History[1]  Medications Ordered Prior to Encounter[2]   ROS see history of present illness  Objective  Physical Exam Vitals:   12/24/23 1409  BP: 120/72  Pulse: 67  Temp: 97.8 F (36.6 C)  SpO2: 95%    BP Readings from Last 3 Encounters:  12/24/23 120/72  06/23/23 118/60  04/20/23 94/63   Wt Readings from Last 3 Encounters:  12/24/23 114 lb 6.4 oz (51.9 kg)  06/23/23 107 lb (48.5 kg)  04/20/23 110 lb (49.9 kg)    Physical Exam Constitutional:      General: She is not in acute distress.    Appearance: Normal appearance.  HENT:     Head: Normocephalic.   Cardiovascular:     Rate and Rhythm: Normal rate and regular rhythm.     Heart sounds: Normal heart sounds.  Pulmonary:     Effort: Pulmonary effort is normal.     Breath sounds: Normal breath sounds.  Skin:    General: Skin is warm and dry.  Neurological:     General: No focal deficit present.     Mental Status: She is alert.  Psychiatric:        Mood and Affect: Mood normal.        Behavior: Behavior normal.      Assessment/Plan: Please see individual problem list.  Chronic obstructive pulmonary disease with emphysema, unspecified emphysema type (HCC) Assessment & Plan: Occasional productive cough. Emphasized the importance of daily Trelegy to reduce symptoms. Denies shortness of breath. Suggested antihistamines and/or Mucinex for mucus management. Use Trelegy inhaler daily and albuterol  inhaler as needed. Encourage smoking cessation.    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 Continue Aricept  and follow up with neurology as scheduled.    Coronary artery disease involving native coronary artery of native heart without angina pectoris Assessment & Plan: Continue ASA 81 mg daily, Lipitor 80 mg daily and Zetia  10 mg daily. Follow up with Cardiology as scheduled.    Pure hypercholesterolemia Assessment & Plan: Her hyperlipidemia is managed  with Lipitor and Zetia . Continued use is necessary to maintain control. Continue Lipitor 80 mg daily and Zetia  10 mg daily.    Tobacco use Assessment & Plan: Lung cancer screening due at end of month. Referral placed. She continues to smoke daily. Encourage cessation.  Orders: -     Ambulatory Referral for Lung Cancer Scre    Return in about 6 months (around 06/23/2024) for Follow up.   [name], NP-C [facility] [place]  [facility]    [1]  Social History Tobacco Use  Smoking Status Every Day   Current packs/day: 0.50   Average packs/day: 0.5 packs/day for 46.0 years (23.0 ttl pk-yrs)   Types: Cigarettes  Smokeless Tobacco Never  Tobacco Comments   only smokes a few a day now  [2]  Current Outpatient Medications on File Prior to Visit  Medication Sig Dispense Refill   albuterol  (VENTOLIN  HFA) 108 (90 Base) MCG/ACT inhaler Inhale 1-2 puffs into the lungs every 4 (four) hours as needed for wheezing or shortness of breath. 54 g 3   aspirin EC 81 MG tablet Take 81 mg by mouth daily.     atorvastatin  (LIPITOR) 80 MG tablet TAKE 1 TABLET DAILY AT 6 P.M. 90 tablet 3   donepezil  (ARICEPT ) 10 MG tablet Take 1 tablet (10 mg total) by mouth daily. 90 tablet 3   ezetimibe  (ZETIA ) 10 MG tablet Take 1 tablet (10 mg total) by mouth daily. 90 tablet 3   Fluticasone -Umeclidin-Vilant (TRELEGY ELLIPTA ) 100-62.5-25 MCG/ACT AEPB Inhale 1 puff into the lungs daily. 180 each 5   No current facility-administered medications on file prior to visit.

## 2023-12-24 NOTE — Assessment & Plan Note (Signed)
 Continue ASA 81 mg daily, Lipitor 80 mg daily and Zetia  10 mg daily. Follow up with Cardiology as scheduled.

## 2023-12-29 NOTE — Telephone Encounter (Signed)
 Detailed vm left on daughters vm informing that VA forms have been signed and ready for pick up. Forms have been placed in designated pick up area near front desk    [id number] PLEASE RELAY THIS INFO TO THEM WHEN THEY RETURN CALL

## 2023-12-29 NOTE — Telephone Encounter (Signed)
 Going to drop a paper by office, she has it to look at and see if this can be filled out. Thanked me for calling back.

## 2023-12-29 NOTE — Telephone Encounter (Signed)
Picked up today.

## 2023-12-29 NOTE — Telephone Encounter (Unsigned)
 Copied from CRM #[id number]. Topic: General - Other >> Dec 29, 2023  8:32 AM Revonda D wrote: Reason for CRM: Delon King(daughter) called in wanting to speak with Camie or her nurse regarding paper work ( [employer]: Examination of Household, Pensions Consultant) that is needed for assistance(additional assistance) from the [place]. It will help with additional funding. She is requesting a call back.   UPDATE: Delon is calling back to confirm if the form has been signed. Delon stated that the forms have to be returned tomorrow so she needs the forms completed so she can come pick them up today. Delon would like a callback with an update. RA:[phone]

## 2024-01-08 ENCOUNTER — Ambulatory Visit: Payer: Self-pay

## 2024-02-29 ENCOUNTER — Ambulatory Visit: Payer: Self-pay | Admitting: Physician Assistant

## 2024-03-07 ENCOUNTER — Ambulatory Visit: Payer: MEDICARE

## 2024-06-27 ENCOUNTER — Ambulatory Visit: Admitting: Nurse Practitioner
